# Patient Record
Sex: Female | Born: 1995 | Race: Black or African American | Hispanic: No | Marital: Single | Smoking: Never smoker
Health system: Southern US, Community
[De-identification: ages and names within clinical notes are randomized; demographics above are authoritative.]

## PROBLEM LIST (undated history)

## (undated) DIAGNOSIS — K219 Gastro-esophageal reflux disease without esophagitis: Secondary | ICD-10-CM

## (undated) DIAGNOSIS — B9620 Unspecified Escherichia coli [E. coli] as the cause of diseases classified elsewhere: Secondary | ICD-10-CM

## (undated) DIAGNOSIS — E119 Type 2 diabetes mellitus without complications: Secondary | ICD-10-CM

## (undated) DIAGNOSIS — K8020 Calculus of gallbladder without cholecystitis without obstruction: Secondary | ICD-10-CM

## (undated) DIAGNOSIS — N390 Urinary tract infection, site not specified: Principal | ICD-10-CM

## (undated) HISTORY — PX: TONSILLECTOMY AND ADENOIDECTOMY: SUR1326

## (undated) HISTORY — DX: Calculus of gallbladder without cholecystitis without obstruction: K80.20

## (undated) HISTORY — DX: Urinary tract infection, site not specified: N39.0

## (undated) HISTORY — DX: Unspecified Escherichia coli (E. coli) as the cause of diseases classified elsewhere: B96.20

---

## 2014-03-20 ENCOUNTER — Ambulatory Visit (INDEPENDENT_AMBULATORY_CARE_PROVIDER_SITE_OTHER): Payer: BC Managed Care – PPO | Admitting: Family Medicine

## 2014-03-20 VITALS — BP 130/80 | HR 69 | Temp 98.7°F | Resp 18 | Ht 64.0 in | Wt 265.0 lb

## 2014-03-20 DIAGNOSIS — Z87898 Personal history of other specified conditions: Secondary | ICD-10-CM

## 2014-03-20 DIAGNOSIS — Z6841 Body Mass Index (BMI) 40.0 and over, adult: Secondary | ICD-10-CM

## 2014-03-20 LAB — POCT CBC
GRANULOCYTE PERCENT: 43 % (ref 37–80)
HCT, POC: 41.6 % (ref 37.7–47.9)
Hemoglobin: 13.4 g/dL (ref 12.2–16.2)
LYMPH, POC: 4.4 — AB (ref 0.6–3.4)
MCH, POC: 25.5 pg — AB (ref 27–31.2)
MCHC: 32.3 g/dL (ref 31.8–35.4)
MCV: 79.1 fL — AB (ref 80–97)
MID (CBC): 0.3 (ref 0–0.9)
MPV: 6.5 fL (ref 0–99.8)
PLATELET COUNT, POC: 418 10*3/uL (ref 142–424)
POC Granulocyte: 3.5 (ref 2–6.9)
POC LYMPH %: 53.5 % — AB (ref 10–50)
POC MID %: 3.5 %M (ref 0–12)
RBC: 5.25 M/uL (ref 4.04–5.48)
RDW, POC: 13.6 %
WBC: 8.2 10*3/uL (ref 4.6–10.2)

## 2014-03-20 NOTE — Progress Notes (Signed)
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 She has a history of coronary to give plasma and on 3 separate occasions and had a pulse greater than 100. Daughters are required to have a pulse rate less than 100. She is healthy otherwise. She does not drink excessive caffeine. She does not smoke or use substances. She is on Implanon. She takes occasional tramadol for back pain and sciatica. She attends [employer] where she is studying human resources and plans to go into nursing after that. She also works as a Clinical cytogeneticistnanny and babysitter.  Objective: Overweight lady in no acute distress. Neck supple without nodes or thyromegaly. Chest clear. Heart regular without murmurs.pulse was 70 at the time hours examining her. She has a mild sinus arrhythmia.Evidence soft without mass or tenderness.  Assessment: History of tachycardia Obesity  Plan: Urged her to lose weight and try to get in more regular cardiac  Exercise.advise avoiding caffeine or other stimulants prior to giving of plasma. I believe she is safe to give plasma. Will check an EKG, TSH and CBC on her.  EKG is normal with a pulse of 73 on the EKG.  Approved for plasma donation

## 2014-03-20 NOTE — Patient Instructions (Signed)
Okay to donate plasma.  I will let you know the results of your thyroid test.

## 2014-03-21 LAB — TSH: TSH: 0.812 u[IU]/mL (ref 0.350–4.500)

## 2014-03-23 ENCOUNTER — Encounter: Payer: Self-pay | Admitting: Family Medicine

## 2014-03-25 ENCOUNTER — Encounter: Payer: Self-pay | Admitting: Family Medicine

## 2015-04-22 ENCOUNTER — Emergency Department (HOSPITAL_COMMUNITY): Payer: Self-pay

## 2015-04-22 ENCOUNTER — Emergency Department (HOSPITAL_COMMUNITY)
Admission: EM | Admit: 2015-04-22 | Discharge: 2015-04-22 | Disposition: A | Discharge status: Home / Self Care | Payer: Self-pay | Attending: Emergency Medicine | Admitting: Emergency Medicine

## 2015-04-22 ENCOUNTER — Encounter (HOSPITAL_COMMUNITY): Payer: Self-pay | Admitting: Emergency Medicine

## 2015-04-22 DIAGNOSIS — N390 Urinary tract infection, site not specified: Secondary | ICD-10-CM | POA: Insufficient documentation

## 2015-04-22 DIAGNOSIS — K8020 Calculus of gallbladder without cholecystitis without obstruction: Secondary | ICD-10-CM | POA: Insufficient documentation

## 2015-04-22 DIAGNOSIS — R61 Generalized hyperhidrosis: Secondary | ICD-10-CM | POA: Insufficient documentation

## 2015-04-22 DIAGNOSIS — Z79818 Long term (current) use of other agents affecting estrogen receptors and estrogen levels: Secondary | ICD-10-CM | POA: Insufficient documentation

## 2015-04-22 LAB — LIPASE, BLOOD: LIPASE: 32 U/L (ref 11–51)

## 2015-04-22 LAB — COMPREHENSIVE METABOLIC PANEL
ALT: 27 U/L (ref 14–54)
AST: 38 U/L (ref 15–41)
Albumin: 3.4 g/dL — ABNORMAL LOW (ref 3.5–5.0)
Alkaline Phosphatase: 76 U/L (ref 38–126)
Anion gap: 9 (ref 5–15)
BUN: 11 mg/dL (ref 6–20)
CALCIUM: 9.5 mg/dL (ref 8.9–10.3)
CHLORIDE: 104 mmol/L (ref 101–111)
CO2: 23 mmol/L (ref 22–32)
CREATININE: 0.76 mg/dL (ref 0.44–1.00)
GFR calc non Af Amer: >60 mL/min (ref >60)
Glucose, Bld: 280 mg/dL — ABNORMAL HIGH (ref 65–99)
Potassium: 3.9 mmol/L (ref 3.5–5.1)
SODIUM: 136 mmol/L (ref 135–145)
TOTAL PROTEIN: 7.8 g/dL (ref 6.5–8.1)
Total Bilirubin: 0.3 mg/dL (ref 0.3–1.2)

## 2015-04-22 LAB — URINALYSIS, ROUTINE W REFLEX MICROSCOPIC
Bilirubin Urine: NEGATIVE
KETONES UR: NEGATIVE mg/dL
Nitrite: POSITIVE — AB
PROTEIN: NEGATIVE mg/dL
Specific Gravity, Urine: 1.019 (ref 1.005–1.030)
pH: 6 (ref 5.0–8.0)

## 2015-04-22 LAB — CBC
HCT: 38 % (ref 36.0–46.0)
Hemoglobin: 12.9 g/dL (ref 12.0–15.0)
MCH: 26.6 pg (ref 26.0–34.0)
MCHC: 33.9 g/dL (ref 30.0–36.0)
MCV: 78.4 fL (ref 78.0–100.0)
PLATELETS: 348 10*3/uL (ref 150–400)
RBC: 4.85 MIL/uL (ref 3.87–5.11)
RDW: 13.7 % (ref 11.5–15.5)
WBC: 9.4 10*3/uL (ref 4.0–10.5)

## 2015-04-22 LAB — URINE MICROSCOPIC-ADD ON

## 2015-04-22 MED ORDER — RANITIDINE HCL 150 MG PO TABS: 150.0000 mg | ORAL_TABLET | Freq: Two times a day (BID) | ORAL | Status: DC

## 2015-04-22 MED ORDER — SULFAMETHOXAZOLE-TRIMETHOPRIM 800-160 MG PO TABS
1.0000 | ORAL_TABLET | Freq: Two times a day (BID) | ORAL | Status: AC
Start: 2015-04-22 — End: 2015-04-29

## 2015-04-22 MED ORDER — HYDROCODONE-ACETAMINOPHEN 5-325 MG PO TABS: 2.0000 | ORAL_TABLET | ORAL | Status: DC | PRN

## 2015-04-22 NOTE — ED Notes (Signed)
Pt from home c/o upper abdominal tightening. She reports that she got real nauseated and sweaty. She reports that she feels fine now. She reports that she has never felt this way before.

## 2015-04-22 NOTE — ED Provider Notes (Signed)
CSN: [id number]     Arrival date & time 04/22/15  0006 History  By signing my name below, I, Ronney LionSuzanne Le, attest that this documentation has been prepared under the direction and in the presence of Gilda Creasehristopher J Alexsandra Shontz, MD. Electronically Signed: Ronney LionSuzanne Le, ED Scribe. 04/22/2015. 1:39 AM.    Chief Complaint  Patient presents with  . Abdominal Pain   The history is provided by the patient. No language interpreter was used.    HPI Comments: Bridget Fletcher is a 19 y.o. female who presents to the Emergency Department complaining of two intermittent episodes of non-radiating epigastric abdominal pain that occurred last night. She reports that the episode last night lasted for 10 minutes and the episode today lasted for an hour. She also complains of feeling nauseated and sweaty during the episode. However, she states that she is presently asymptomatic. This is a new problem. She denies SOB.   History reviewed. No pertinent past medical history. History reviewed. No pertinent past surgical history. No family history on file. Social History  Substance Use Topics  . Smoking status: Never Smoker   . Smokeless tobacco: None  . Alcohol Use: No   OB History    No data available     Review of Systems  Constitutional: Positive for diaphoresis.  Gastrointestinal: Positive for nausea and abdominal pain.  All other systems reviewed and are negative.  Allergies  Review of patient's allergies indicates no known allergies.  Home Medications   Prior to Admission medications   Medication Sig Start Date End Date Taking? Authorizing Provider  etonogestrel (NEXPLANON) 68 MG IMPL implant 1 each by Subdermal route once.    Historical Provider, MD  etonogestrel-ethinyl estradiol (NUVARING) 0.12-0.015 MG/24HR vaginal ring Place 1 each vaginally every 28 (twenty-eight) days. Insert vaginally and leave in place for 3 consecutive weeks, then remove for 1 week.    Historical Provider, MD   HYDROcodone-acetaminophen (NORCO/VICODIN) 5-325 MG tablet Take 2 tablets by mouth every 4 (four) hours as needed for moderate pain. 04/22/15   Gilda Creasehristopher J Brooklin Rieger, MD  ranitidine (ZANTAC) 150 MG tablet Take 1 tablet (150 mg total) by mouth 2 (two) times daily. 04/22/15   Gilda Creasehristopher J Giovanni Bath, MD  sulfamethoxazole-trimethoprim (BACTRIM DS,SEPTRA DS) 800-160 MG tablet Take 1 tablet by mouth 2 (two) times daily. 04/22/15 04/29/15  Gilda Creasehristopher J Srihith Aquilino, MD   BP 123/106 mmHg  Pulse 99  Temp(Src) 98.7 F (37.1 C) (Oral)  Resp 16  Ht 5\' 4"  (1.626 m)  Wt 270 lb (122.471 kg)  BMI 46.32 kg/m2  SpO2 100%  LMP 04/08/2015 Physical Exam  Constitutional: She is oriented to person, place, and time. She appears well-developed and well-nourished. No distress.  HENT:  Head: Normocephalic and atraumatic.  Right Ear: Hearing normal.  Left Ear: Hearing normal.  Nose: Nose normal.  Mouth/Throat: Oropharynx is clear and moist and mucous membranes are normal.  Eyes: Conjunctivae and EOM are normal. Pupils are equal, round, and reactive to light.  Neck: Normal range of motion. Neck supple.  Cardiovascular: Regular rhythm, S1 normal and S2 normal.  Exam reveals no gallop and no friction rub.   No murmur heard. Pulmonary/Chest: Effort normal and breath sounds normal. No respiratory distress. She exhibits no tenderness.  Abdominal: Soft. Normal appearance and bowel sounds are normal. There is no hepatosplenomegaly. There is no tenderness. There is no rebound, no guarding, no tenderness at McBurney's point and negative Murphy's sign. No hernia.  Musculoskeletal: Normal range of motion.  Neurological: She is alert  and oriented to person, place, and time. She has normal strength. No cranial nerve deficit or sensory deficit. Coordination normal. GCS eye subscore is 4. GCS verbal subscore is 5. GCS motor subscore is 6.  Skin: Skin is warm, dry and intact. No rash noted. No cyanosis.  Psychiatric: She has a normal  mood and affect. Her speech is normal and behavior is normal. Thought content normal.  Nursing note and vitals reviewed.   ED Course  Procedures (including critical care time)  DIAGNOSTIC STUDIES: Oxygen Saturation is 100% on RA, normal by my interpretation.    COORDINATION OF CARE: 1:04 AM - Discussed treatment plan with pt at bedside which includes diagnostic tests. Pt verbalized understanding and agreed to plan.   Labs Review Labs Reviewed  COMPREHENSIVE METABOLIC PANEL - Abnormal; Notable for the following:    Glucose, Bld 280 (*)    Albumin 3.4 (*)    All other components within normal limits  URINALYSIS, ROUTINE W REFLEX MICROSCOPIC (NOT AT [facility]) - Abnormal; Notable for the following:    APPearance CLOUDY (*)    Glucose, UA >1000 (*)    Hgb urine dipstick SMALL (*)    Nitrite POSITIVE (*)    Leukocytes, UA MODERATE (*)    All other components within normal limits  URINE MICROSCOPIC-ADD ON - Abnormal; Notable for the following:    Squamous Epithelial / LPF 0-5 (*)    Bacteria, UA FEW (*)    All other components within normal limits  LIPASE, BLOOD  CBC    Imaging Review US Abdomen Limited Ruq  04/22/2015  CLINICAL DATA:  Right upper quadrant pain for 2 days.  Nausea. EXAM: US ABDOMEN LIMITED - RIGHT UPPER QUADRANT COMPARISON:  None. FINDINGS: Gallbladder: There are multiple calculi within the contracted gallbladder. There is no gallbladder mural thickening or pericholecystic fluid. The patient was not tender over the gallbladder. Common bile duct: Diameter: 3.5 mm Liver: There is generalized echogenicity and coarsening of hepatic echotexture without focal lesion. This could represent cirrhosis or fatty infiltration. IMPRESSION: Cholelithiasis. Dense coarsened hepatic echotexture suggesting cirrhosis or fatty infiltration. No focal liver lesion. Electronically Signed   By: Ellery Plunk M.D.   On: 04/22/2015 02:16   I have personally reviewed and evaluated these images  and lab results as part of my medical decision-making.   EKG Interpretation None      MDM   Final diagnoses:  Calculus of gallbladder without cholecystitis without obstruction  UTI (lower urinary tract infection)   Presented to the ER for evaluation of abdominal pain. Patient has had 2 episodes of upper abdominal pain associated with nausea and no vomiting. Both episodes spontaneously resolved. Neither was longer than 1 hour. She is symptom-free at arrival. Workup reveals gallstone. Symptoms likely secondary to cholelithiasis and biliary colic. Patient also has evidence of urinary tract infection. Treated for UTI, discharged and follow-up with general surgery. Given return precautions.   I personally performed the services described in this documentation, which was scribed in my presence. The recorded information has been reviewed and is accurate.'     Gilda Crease, MD 04/22/15 2308

## 2015-04-22 NOTE — ED Notes (Signed)
Patient is with Ultrasound

## 2015-04-22 NOTE — Discharge Instructions (Signed)
Biliary Colic Biliary colic is a pain in the upper abdomen. The pain:  Is usually felt on the right side of the abdomen, but it may also be felt in the center of the abdomen, just below the breastbone (sternum).  May spread back toward the right shoulder blade.  May be steady or irregular.  May be accompanied by nausea and vomiting. Most of the time, the pain goes away in 1-5 hours. After the most intense pain passes, the abdomen may continue to ache mildly for about 24 hours. Biliary colic is caused by a blockage in the bile duct. The bile duct is a pathway that carries bile--a liquid that helps to digest fats--from the gallbladder to the small intestine. Biliary colic usually occurs after eating, when the digestive system demands bile. The pain develops when muscle cells contract forcefully to try to move the blockage so that bile can get by. HOME CARE INSTRUCTIONS  Take medicines only as directed by your health care provider.  Drink enough fluid to keep your urine clear or pale yellow.  Avoid fatty, greasy, and fried foods. These kinds of foods increase your body's demand for bile.  Avoid any foods that make your pain worse.  Avoid overeating.  Avoid having a large meal after fasting. SEEK MEDICAL CARE IF:  You develop a fever.  Your pain gets worse.  You vomit.  You develop nausea that prevents you from eating and drinking. SEEK IMMEDIATE MEDICAL CARE IF:  You suddenly develop a fever and shaking chills.  You develop a yellowish discoloration (jaundice) of:  Skin.  Whites of the eyes.  Mucous membranes.  You have continuous or severe pain that is not relieved with medicines.  You have nausea and vomiting that is not relieved with medicines.  You develop dizziness or you faint.   This information is not intended to replace advice given to you by your health care provider. Make sure you discuss any questions you have with your health care provider.   Document  Released: 10/06/2005 Document Revised: 09/19/2014 Document Reviewed: 02/14/2014 Elsevier Interactive Patient Education 2016 Elsevier Inc.  Urinary Tract Infection Urinary tract infections (UTIs) can develop anywhere along your urinary tract. Your urinary tract is your body's drainage system for removing wastes and extra water. Your urinary tract includes two kidneys, two ureters, a bladder, and a urethra. Your kidneys are a pair of bean-shaped organs. Each kidney is about the size of your fist. They are located below your ribs, one on each side of your spine. CAUSES Infections are caused by microbes, which are microscopic organisms, including fungi, viruses, and bacteria. These organisms are so small that they can only be seen through a microscope. Bacteria are the microbes that most commonly cause UTIs. SYMPTOMS  Symptoms of UTIs may vary by age and gender of the patient and by the location of the infection. Symptoms in young women typically include a frequent and intense urge to urinate and a painful, burning feeling in the bladder or urethra during urination. Older women and men are more likely to be tired, shaky, and weak and have muscle aches and abdominal pain. A fever may mean the infection is in your kidneys. Other symptoms of a kidney infection include pain in your back or sides below the ribs, nausea, and vomiting. DIAGNOSIS To diagnose a UTI, your caregiver will ask you about your symptoms. Your caregiver will also ask you to provide a urine sample. The urine sample will be tested for bacteria and white  blood cells. White blood cells are made by your body to help fight infection. TREATMENT  Typically, UTIs can be treated with medication. Because most UTIs are caused by a bacterial infection, they usually can be treated with the use of antibiotics. The choice of antibiotic and length of treatment depend on your symptoms and the type of bacteria causing your infection. HOME CARE  INSTRUCTIONS  If you were prescribed antibiotics, take them exactly as your caregiver instructs you. Finish the medication even if you feel better after you have only taken some of the medication.  Drink enough water and fluids to keep your urine clear or pale yellow.  Avoid caffeine, tea, and carbonated beverages. They tend to irritate your bladder.  Empty your bladder often. Avoid holding urine for long periods of time.  Empty your bladder before and after sexual intercourse.  After a bowel movement, women should cleanse from front to back. Use each tissue only once. SEEK MEDICAL CARE IF:   You have back pain.  You develop a fever.  Your symptoms do not begin to resolve within 3 days. SEEK IMMEDIATE MEDICAL CARE IF:   You have severe back pain or lower abdominal pain.  You develop chills.  You have nausea or vomiting.  You have continued burning or discomfort with urination. MAKE SURE YOU:   Understand these instructions.  Will watch your condition.  Will get help right away if you are not doing well or get worse.   This information is not intended to replace advice given to you by your health care provider. Make sure you discuss any questions you have with your health care provider.   Document Released: 02/12/2005 Document Revised: 01/24/2015 Document Reviewed: 06/13/2011 Elsevier Interactive Patient Education [employer]

## 2015-05-23 ENCOUNTER — Other Ambulatory Visit (INDEPENDENT_AMBULATORY_CARE_PROVIDER_SITE_OTHER): Payer: Managed Care, Other (non HMO)

## 2015-05-23 ENCOUNTER — Encounter: Payer: Self-pay | Admitting: Internal Medicine

## 2015-05-23 ENCOUNTER — Ambulatory Visit (INDEPENDENT_AMBULATORY_CARE_PROVIDER_SITE_OTHER): Payer: Managed Care, Other (non HMO) | Admitting: Internal Medicine

## 2015-05-23 VITALS — BP 120/70 | HR 80 | Temp 98.7°F | Resp 16 | Ht 64.0 in | Wt 279.4 lb

## 2015-05-23 DIAGNOSIS — E669 Obesity, unspecified: Secondary | ICD-10-CM

## 2015-05-23 DIAGNOSIS — K760 Fatty (change of) liver, not elsewhere classified: Secondary | ICD-10-CM | POA: Diagnosis not present

## 2015-05-23 DIAGNOSIS — K8020 Calculus of gallbladder without cholecystitis without obstruction: Secondary | ICD-10-CM

## 2015-05-23 DIAGNOSIS — R7309 Other abnormal glucose: Secondary | ICD-10-CM | POA: Diagnosis not present

## 2015-05-23 DIAGNOSIS — E1169 Type 2 diabetes mellitus with other specified complication: Secondary | ICD-10-CM | POA: Insufficient documentation

## 2015-05-23 DIAGNOSIS — R739 Hyperglycemia, unspecified: Secondary | ICD-10-CM

## 2015-05-23 LAB — COMPREHENSIVE METABOLIC PANEL
ALBUMIN: 3.3 g/dL — AB (ref 3.5–5.2)
ALT: 14 U/L (ref 0–35)
AST: 14 U/L (ref 0–37)
Alkaline Phosphatase: 73 U/L (ref 39–117)
BUN: 8 mg/dL (ref 6–23)
CALCIUM: 9.2 mg/dL (ref 8.4–10.5)
CO2: 24 mEq/L (ref 19–32)
CREATININE: 0.73 mg/dL (ref 0.40–1.20)
Chloride: 104 mEq/L (ref 96–112)
GFR: 128.82 mL/min (ref >60.00)
Glucose, Bld: 142 mg/dL — ABNORMAL HIGH (ref 70–99)
Potassium: 3.9 mEq/L (ref 3.5–5.1)
Sodium: 138 mEq/L (ref 135–145)
TOTAL PROTEIN: 6.9 g/dL (ref 6.0–8.3)
Total Bilirubin: 0.3 mg/dL (ref 0.2–1.2)

## 2015-05-23 LAB — LIPID PANEL
Cholesterol: 181 mg/dL (ref 0–200)
HDL: 62.4 mg/dL (ref >39.00)
LDL Cholesterol: 84 mg/dL (ref 0–99)
NONHDL: 118.75
TRIGLYCERIDES: 174 mg/dL — AB (ref 0.0–149.0)
Total CHOL/HDL Ratio: 3
VLDL: 34.8 mg/dL (ref 0.0–40.0)

## 2015-05-23 LAB — HEMOGLOBIN A1C: HEMOGLOBIN A1C: 6.5 % (ref 4.6–6.5)

## 2015-05-23 LAB — TSH: TSH: 1.08 u[IU]/mL (ref 0.35–4.50)

## 2015-05-23 NOTE — Assessment & Plan Note (Signed)
Checking HgA1c, family history of diabetes for multiple people. Does have concurrent morbid obesity.

## 2015-05-23 NOTE — Progress Notes (Signed)
   Subjective:    Patient ID: Bridget Fletcher, female    DOB: 04/03/1996, 20 y.o.   MRN: [id number]  HPI The patient is a new 20 YO female coming in for gallbladder pain. She had a high sugar at the ER and the surgeon wants her to see a primary doctor before they will agree to do her gall bladder surgery. She is having some pains in her RUQ. Not worsened by foods. She does not exercise. Diabetes does run in her family. No fevers or chills. No chest pain or abdominal pain.   Review of Systems  Constitutional: Positive for activity change. Negative for fever, chills, appetite change, fatigue and unexpected weight change.  HENT: Negative.   Eyes: Negative.   Respiratory: Negative for cough, chest tightness, shortness of breath and wheezing.   Cardiovascular: Negative for chest pain, palpitations and leg swelling.  Gastrointestinal: Positive for diarrhea. Negative for nausea, abdominal pain, constipation and abdominal distention.  Musculoskeletal: Positive for myalgias. Negative for back pain and arthralgias.  Skin: Negative.   Neurological: Negative for dizziness, weakness, numbness and headaches.  Psychiatric/Behavioral: Negative.       Objective:   Physical Exam  Constitutional: She is oriented to person, place, and time. She appears well-developed and well-nourished.  Overweight  HENT:  Head: Normocephalic and atraumatic.  Eyes: EOM are normal.  Neck: Normal range of motion.  Cardiovascular: Normal rate and regular rhythm.   Pulmonary/Chest: Effort normal and breath sounds normal. No respiratory distress. She has no wheezes. She has no rales.  Abdominal: Soft. Bowel sounds are normal. She exhibits no distension. There is no tenderness. There is no rebound.  Musculoskeletal: She exhibits no edema.  Neurological: She is alert and oriented to person, place, and time. Coordination normal.  Skin: Skin is warm and dry.  Psychiatric: She has a normal mood and affect.   Filed Vitals:   05/23/15 0903  BP: 120/70  Pulse: 80  Temp: 98.7 F (37.1 C)  TempSrc: Oral  Resp: 16  Height: 5\' 4"  (1.626 m)  Weight: 279 lb 6.4 oz (126.735 kg)      Assessment & Plan:

## 2015-05-23 NOTE — Assessment & Plan Note (Signed)
Changes in echotexture on ultrasound and talked to her about the significance of that finding. She will work on exercise to help her to become healthier. Checking hepatitis panel to make sure no additional cause.

## 2015-05-23 NOTE — Assessment & Plan Note (Signed)
Complicated by cholelithiasis, fatty liver, elevated sugars. No HTN. Counseled her about the need to start an exercise program and work on eating healthier to form healthy habits. She declines nutritional counseling today. Checking labs.

## 2015-05-23 NOTE — Progress Notes (Signed)
Pre visit review using our clinic review tool, if applicable. No additional management support is needed unless otherwise documented below in the visit note. 

## 2015-05-23 NOTE — Assessment & Plan Note (Signed)
Will check labs today. If uncontrolled sugars would recommend to treat before surgery as she is not having symptoms at this time.

## 2015-05-23 NOTE — Patient Instructions (Signed)
We will check the labs today and call you back with the results.   It is really important to work on exercising to keep your body healthy and working on eating healthier foods. They saw some extra fat in the liver on the ultrasound along with the stones in the gallbladder. Working on exercise and diet can help with that as well. Having extra fat in the liver can cause the liver to age more quickly and wear out earlier.  Exercising to Stay Healthy Exercising regularly is important. It has many health benefits, such as:  Improving your overall fitness, flexibility, and endurance.  Increasing your bone density.  Helping with weight control.  Decreasing your body fat.  Increasing your muscle strength.  Reducing stress and tension.  Improving your overall health. In order to become healthy and stay healthy, it is recommended that you do moderate-intensity and vigorous-intensity exercise. You can tell that you are exercising at a moderate intensity if you have a higher heart rate and faster breathing, but you are still able to hold a conversation. You can tell that you are exercising at a vigorous intensity if you are breathing much harder and faster and cannot hold a conversation while exercising. HOW OFTEN SHOULD I EXERCISE? Choose an activity that you enjoy and set realistic goals. Your health care provider can help you to make an activity plan that works for you. Exercise regularly as directed by your health care provider. This may include:   Doing resistance training twice each week, such as:  Push-ups.  Sit-ups.  Lifting weights.  Using resistance bands.  Doing a given intensity of exercise for a given amount of time. Choose from these options:  150 minutes of moderate-intensity exercise every week.  75 minutes of vigorous-intensity exercise every week.  A mix of moderate-intensity and vigorous-intensity exercise every week. Children, pregnant women, people who are out of  shape, people who are overweight, and older adults may need to consult a health care provider for individual recommendations. If you have any sort of medical condition, be sure to consult your health care provider before starting a new exercise program.  WHAT ARE SOME EXERCISE IDEAS? Some moderate-intensity exercise ideas include:   Walking at a rate of 1 mile in 15 minutes.  Biking.  Hiking.  Golfing.  Dancing. Some vigorous-intensity exercise ideas include:   Walking at a rate of at least 4.5 miles per hour.  Jogging or running at a rate of 5 miles per hour.  Biking at a rate of at least 10 miles per hour.  Lap swimming.  Roller-skating or in-line skating.  Cross-country skiing.  Vigorous competitive sports, such as football, basketball, and soccer.  Jumping rope.  Aerobic dancing. WHAT ARE SOME EVERYDAY ACTIVITIES THAT CAN HELP ME TO GET EXERCISE?  Yard work, such as:  Child psychotherapist.  Raking and bagging leaves.  Washing and waxing your car.  Pushing a stroller.  Shoveling snow.  Gardening.  Washing windows or floors. HOW CAN I BE MORE ACTIVE IN MY DAY-TO-DAY ACTIVITIES?  Use the stairs instead of the elevator.  Take a walk during your lunch break.  If you drive, park your car farther away from work or school.  If you take public transportation, get off one stop early and walk the rest of the way.  Make all of your phone calls while standing up and walking around.  Get up, stretch, and walk around every 30 minutes throughout the day. WHAT GUIDELINES SHOULD I  FOLLOW WHILE EXERCISING?  Do not exercise so much that you hurt yourself, feel dizzy, or get very short of breath.  Consult your health care provider before starting a new exercise program.  Wear comfortable clothes and shoes with good support.  Drink plenty of water while you exercise to prevent dehydration or heat stroke. Body water is lost during exercise and must be  replaced.  Work out until you breathe faster and your heart beats faster.   Fatty Liver Fatty liver, also called hepatic steatosis or steatohepatitis, is a condition in which too much fat has built up in your liver cells. The liver removes harmful substances from your bloodstream. It produces fluids your body needs. It also helps your body use and store energy from the food you eat. In many cases, fatty liver does not cause symptoms or problems. It is often diagnosed when tests are being done for other reasons. However, over time, fatty liver can cause inflammation that may lead to more serious liver problems, such as scarring of the liver (cirrhosis). CAUSES  Causes of fatty liver may include:   Drinking too much alcohol.  Poor nutrition.  Obesity.  Cushing syndrome.  Diabetes.  Hyperlipidemia.  Pregnancy.  Certain drugs.  Poisons.  Some viral infections. RISK FACTORS You may be more likely to develop fatty liver if you:  Abuse alcohol.  Are pregnant.  Are overweight.  Have diabetes.  Have hepatitis.  Have a high triglyceride level.  SIGNS AND SYMPTOMS  Fatty liver often does not cause any symptoms. In cases where symptoms develop, they can include:  Fatigue.  Weakness.  Weight loss.  Confusion.   Abdominal pain.  Yellowing of your skin and the white parts of your eyes (jaundice).  Nausea and vomiting. DIAGNOSIS  Fatty liver may be diagnosed by:   Physical exam and medical history.  Blood tests.  Imaging tests, such as an ultrasound, CT scan, or MRI.  Liver biopsy. A small sample of liver tissue is removed using a needle. The sample is then looked at under a microscope. TREATMENT  Fatty liver is often caused by other health conditions. Treatment for fatty liver may involve medicines and lifestyle changes to manage conditions such as:   Alcoholism.  High cholesterol.  Diabetes.  Being overweight or obese.  HOME CARE INSTRUCTIONS  Eat  a healthy diet as directed by your health care provider.  Exercise regularly. This can help you lose weight and control your cholesterol and diabetes. Talk to your health care provider about an exercise plan and which activities are best for you.  Do not drink alcohol.   Take medicines only as directed by your health care provider. SEEK MEDICAL CARE IF: You have difficulty controlling your:  Blood sugar.  Cholesterol.  Alcohol consumption. SEEK IMMEDIATE MEDICAL CARE IF:  You have abdominal pain.  You have jaundice.  You have nausea and vomiting.   This information is not intended to replace advice given to you by your health care provider. Make sure you discuss any questions you have with your health care provider.   Document Released: 06/20/2005 Document Revised: 05/26/2014 Document Reviewed: 09/14/2013 Elsevier Interactive Patient Education [employer]  This information is not intended to replace advice given to you by your health care provider. Make sure you discuss any questions you have with your health care provider.   Document Released: 06/07/2010 Document Revised: 05/26/2014 Document Reviewed: 10/06/2013 Elsevier Interactive Patient Education [employer]

## 2015-05-24 ENCOUNTER — Telehealth: Payer: Self-pay | Admitting: Internal Medicine

## 2015-05-24 LAB — HEPATITIS PANEL, ACUTE
HCV Ab: NEGATIVE
HEP B S AG: NEGATIVE
Hep A IgM: NONREACTIVE
Hep B C IgM: NONREACTIVE

## 2015-05-24 MED ORDER — FLUTICASONE PROPIONATE 50 MCG/ACT NA SUSP: 2.0000 | Freq: Every day | NASAL | Status: DC

## 2015-05-24 NOTE — Telephone Encounter (Signed)
Done

## 2015-05-24 NOTE — Telephone Encounter (Signed)
Pt called state that Dr. Okey Duprerawford was going to send in Flonase to [facility]. Please check

## 2015-05-25 ENCOUNTER — Other Ambulatory Visit: Payer: Self-pay | Admitting: Internal Medicine

## 2015-05-25 MED ORDER — METFORMIN HCL 500 MG PO TABS: 500.0000 mg | ORAL_TABLET | Freq: Two times a day (BID) | ORAL | Status: DC

## 2015-06-04 ENCOUNTER — Ambulatory Visit: Payer: Self-pay | Admitting: General Surgery

## 2015-06-04 NOTE — H&P (Signed)
Bridget Fletcher 05/02/2015 10:43 AM Location: [facility] Patient #: [id number] DOB: 1995/09/02 Single / Language: [place] / Race: Black or African American Female   History of Present Illness Adolph Pollack MD; 05/02/2015 12:50 PM) Patient words: gallstones.  The patient is a 20 year old female.  Note:She is referred by Dr. Blinda Leatherwood, emergency department physician, because of symptomatic cholelithiasis. She had 2 separate episodes of epigastric type pain following a fatty meal. These were typical of biliary colic. She presented to the emergency department. Ultrasound report as below. Liver function tests were normal. Blood sugar was elevated at 280. CBC was normal. She has not been evaluated for the hyperglycemia.   US ABDOMEN LIMITED - RIGHT UPPER QUADRANT  COMPARISON: None.  FINDINGS: Gallbladder:  There are multiple calculi within the contracted gallbladder. There is no gallbladder mural thickening or pericholecystic fluid. The patient was not tender over the gallbladder.  Common bile duct:  Diameter: 3.5 mm  Liver:  There is generalized echogenicity and coarsening of hepatic echotexture without focal lesion. This could represent cirrhosis or fatty infiltration.  IMPRESSION: Cholelithiasis. Dense coarsened hepatic echotexture suggesting cirrhosis or fatty infiltration. No focal liver lesion.  Other Problems Fay Records, CMA; 05/02/2015 10:43 AM) Back Pain Cholelithiasis Gastroesophageal Reflux Disease Migraine Headache  Past Surgical History Fay Records, CMA; 05/02/2015 10:43 AM) Tonsillectomy  Diagnostic Studies History Fay Records, CMA; 05/02/2015 10:43 AM) Colonoscopy never Mammogram never Pap Smear 1-5 years ago  Allergies Fay Records, CMA; 05/02/2015 10:44 AM) No Known Drug Allergies12/14/2016  Medication History Fay Records, CMA; 05/02/2015 10:44 AM) NuvaRing (0.12-0.015MG /24HR Ring, Vaginal)  Active. Hydrocodone-Acetaminophen (5-325MG  Tablet, Oral) Active. Zantac (150MG  Tablet, Oral) Active. Medications Reconciled  Social History Fay Records, [place]; 05/02/2015 10:43 AM) Alcohol use Occasional alcohol use. Caffeine use Carbonated beverages, Coffee, Tea. No drug use Tobacco use Never smoker.  Family History Fay Records, [place]; 05/02/2015 10:43 AM) Hypertension Mother.  Pregnancy / Birth History Fay Records, CMA; 05/02/2015 10:43 AM) Age at menarche 9 years. Contraceptive History Contraceptive implant, Depo-provera, Oral contraceptives. Gravida 0 Irregular periods Para 0    Review of Systems Fay Records CMA; 05/02/2015 10:43 AM) General Not Present- Appetite Loss, Chills, Fatigue, Fever, Night Sweats, Weight Gain and Weight Loss. Skin Not Present- Change in Wart/Mole, Dryness, Hives, Jaundice, New Lesions, Non-Healing Wounds, Rash and Ulcer. HEENT Not Present- Earache, Hearing Loss, Hoarseness, Nose Bleed, Oral Ulcers, Ringing in the Ears, Seasonal Allergies, Sinus Pain, Sore Throat, Visual Disturbances, Wears glasses/contact lenses and Yellow Eyes. Respiratory Not Present- Bloody sputum, Chronic Cough, Difficulty Breathing, Snoring and Wheezing. Breast Not Present- Breast Mass, Breast Pain, Nipple Discharge and Skin Changes. Cardiovascular Not Present- Chest Pain, Difficulty Breathing Lying Down, Leg Cramps, Palpitations, Rapid Heart Rate, Shortness of Breath and Swelling of Extremities. Gastrointestinal Present- Nausea. Not Present- Abdominal Pain, Bloating, Bloody Stool, Change in Bowel Habits, Chronic diarrhea, Constipation, Difficulty Swallowing, Excessive gas, Gets full quickly at meals, Hemorrhoids, Indigestion, Rectal Pain and Vomiting. Female Genitourinary Not Present- Frequency, Nocturia, Painful Urination, Pelvic Pain and Urgency. Musculoskeletal Not Present- Back Pain, Joint Pain, Joint Stiffness, Muscle Pain, Muscle Weakness and Swelling of  Extremities. Neurological Not Present- Decreased Memory, Fainting, Headaches, Numbness, Seizures, Tingling, Tremor, Trouble walking and Weakness. Psychiatric Not Present- Anxiety, Bipolar, Change in Sleep Pattern, Depression, Fearful and Frequent crying. Endocrine Not Present- Cold Intolerance, Excessive Hunger, Hair Changes, Heat Intolerance, Hot flashes and New Diabetes. Hematology Not Present- Easy Bruising, Excessive bleeding, Gland problems, HIV and Persistent Infections.  Vitals Fay Records CMA; 05/02/2015 10:45  AM) 05/02/2015 10:44 AM Weight: 275 lb Height: 64in Body Surface Area: 2.24 m Body Mass Index: 47.2 kg/m  Temp.: 97.39F(Temporal)  Pulse: 80 (Regular)  BP: 128/82 (Sitting, Left Arm, Standard)       Assessment & Plan Adolph Pollack(Colena Ketterman J. Catheryn Slifer MD; 05/02/2015 11:18 AM) SYMPTOMATIC CHOLELITHIASIS (K80.20) Impression: She has had 2 episodes following fatty foods.  Plan: After her hyperglycemia is worked up, I recommended proceeding with laparoscopic cholecystectomy with cholangiogram. I also discussed a strict low-fat diet with her. I have explained the procedure, risks, and aftercare of cholecystectomy. Risks include but are not limited to bleeding, infection, wound problems, anesthesia, diarrhea, bile leak, injury to common bile duct/liver/intestine. She seems to understand and agrees to proceed.  Avel Peaceodd Ajeet Casasola, MD

## 2015-07-03 NOTE — Patient Instructions (Addendum)
Bridget Fletcher  07/03/2015   Your procedure is scheduled on: 07/06/2015   Report to [facility] Main  Entrance take [place]  elevators to 3rd floor to  Short Stay Center at   0700 AM.  Call this number if you have problems the morning of surgery [phone]   Remember: ONLY 1 PERSON MAY GO WITH YOU TO SHORT STAY TO GET  READY MORNING OF YOUR SURGERY.  Do not eat food or drink liquids :After Midnight.             Eat a good healthy snack prior to bedtime      Take these medicines the morning of surgery with A SIP OF WATER: Flonase if needed, Hydrocodone if needed , Ranitidine  DO NOT TAKE ANY DIABETIC MEDICATIONS DAY OF YOUR SURGERY                               You may not have any metal on your body including hair pins and              piercings  Do not wear jewelry, make-up, lotions, powders or perfumes, deodorant             Do not wear nail polish.  Do not shave  48 hours prior to surgery.             Do not bring valuables to the hospital. [facility] IS NOT             RESPONSIBLE   FOR VALUABLES.  Contacts, dentures or bridgework may not be worn into surgery.      Patients discharged the day of surgery will not be allowed to drive home.  Name and phone number of your driver:  Special Instructions: coughing and deep breathing exercises, leg exercises               Please read over the following fact sheets you were given: _____________________________________________________________________             [facility] - Preparing for Surgery Before surgery, you can play an important role.  Because skin is not sterile, your skin needs to be as free of germs as possible.  You can reduce the number of germs on your skin by washing with CHG (chlorahexidine gluconate) soap before surgery.  CHG is an antiseptic cleaner which kills germs and bonds with the skin to continue killing germs even after washing. Please DO NOT use if you have an allergy to CHG or  antibacterial soaps.  If your skin becomes reddened/irritated stop using the CHG and inform your nurse when you arrive at Short Stay. Do not shave (including legs and underarms) for at least 48 hours prior to the first CHG shower.  You may shave your face/neck. Please follow these instructions carefully:  1.  Shower with CHG Soap the night before surgery and the  morning of Surgery.  2.  If you choose to wash your hair, wash your hair first as usual with your  normal  shampoo.  3.  After you shampoo, rinse your hair and body thoroughly to remove the  shampoo.                           4.  Use CHG as you would any other liquid  soap.  You can apply chg directly  to the skin and wash                       Gently with a scrungie or clean washcloth.  5.  Apply the CHG Soap to your body ONLY FROM THE NECK DOWN.   Do not use on face/ open                           Wound or open sores. Avoid contact with eyes, ears mouth and genitals (private parts).                       Wash face,  Genitals (private parts) with your normal soap.             6.  Wash thoroughly, paying special attention to the area where your surgery  will be performed.  7.  Thoroughly rinse your body with warm water from the neck down.  8.  DO NOT shower/wash with your normal soap after using and rinsing off  the CHG Soap.                9.  Pat yourself dry with a clean towel.            10.  Wear clean pajamas.            11.  Place clean sheets on your bed the night of your first shower and do not  sleep with pets. Day of Surgery : Do not apply any lotions/deodorants the morning of surgery.  Please wear clean clothes to the hospital/surgery center.  FAILURE TO FOLLOW THESE INSTRUCTIONS MAY RESULT IN THE CANCELLATION OF YOUR SURGERY PATIENT SIGNATURE_________________________________  NURSE SIGNATURE__________________________________  ________________________________________________________________________

## 2015-07-04 ENCOUNTER — Encounter (HOSPITAL_COMMUNITY)
Admission: RE | Admit: 2015-07-04 | Discharge: 2015-07-04 | Disposition: A | Discharge status: Home / Self Care | Payer: Managed Care, Other (non HMO) | Source: Ambulatory Visit | Attending: General Surgery | Admitting: General Surgery

## 2015-07-04 ENCOUNTER — Encounter (HOSPITAL_COMMUNITY): Payer: Self-pay

## 2015-07-04 DIAGNOSIS — K219 Gastro-esophageal reflux disease without esophagitis: Secondary | ICD-10-CM | POA: Diagnosis not present

## 2015-07-04 DIAGNOSIS — K8020 Calculus of gallbladder without cholecystitis without obstruction: Secondary | ICD-10-CM | POA: Diagnosis present

## 2015-07-04 DIAGNOSIS — K8010 Calculus of gallbladder with chronic cholecystitis without obstruction: Secondary | ICD-10-CM | POA: Diagnosis not present

## 2015-07-04 DIAGNOSIS — E1165 Type 2 diabetes mellitus with hyperglycemia: Secondary | ICD-10-CM | POA: Diagnosis not present

## 2015-07-04 DIAGNOSIS — Z79899 Other long term (current) drug therapy: Secondary | ICD-10-CM | POA: Diagnosis not present

## 2015-07-04 DIAGNOSIS — Z7984 Long term (current) use of oral hypoglycemic drugs: Secondary | ICD-10-CM | POA: Diagnosis not present

## 2015-07-04 HISTORY — DX: Gastro-esophageal reflux disease without esophagitis: K21.9

## 2015-07-04 HISTORY — DX: Type 2 diabetes mellitus without complications: E11.9

## 2015-07-04 LAB — BASIC METABOLIC PANEL
ANION GAP: 9 (ref 5–15)
BUN: 5 mg/dL — AB (ref 6–20)
CALCIUM: 8.6 mg/dL — AB (ref 8.9–10.3)
CO2: 25 mmol/L (ref 22–32)
CREATININE: 0.68 mg/dL (ref 0.44–1.00)
Chloride: 104 mmol/L (ref 101–111)
GFR calc Af Amer: >60 mL/min (ref >60)
GLUCOSE: 116 mg/dL — AB (ref 65–99)
Potassium: 3.8 mmol/L (ref 3.5–5.1)
Sodium: 138 mmol/L (ref 135–145)

## 2015-07-04 LAB — CBC
HCT: 38.7 % (ref 36.0–46.0)
HEMOGLOBIN: 12.5 g/dL (ref 12.0–15.0)
MCH: 26 pg (ref 26.0–34.0)
MCHC: 32.3 g/dL (ref 30.0–36.0)
MCV: 80.5 fL (ref 78.0–100.0)
Platelets: 384 10*3/uL (ref 150–400)
RBC: 4.81 MIL/uL (ref 3.87–5.11)
RDW: 13.8 % (ref 11.5–15.5)
WBC: 5.6 10*3/uL (ref 4.0–10.5)

## 2015-07-04 LAB — HCG, SERUM, QUALITATIVE: PREG SERUM: NEGATIVE

## 2015-07-04 NOTE — Anesthesia Preprocedure Evaluation (Addendum)
Anesthesia Evaluation  Patient identified by MRN, date of birth, ID band Patient awake    Reviewed: Allergy & Precautions, NPO status , Patient's Chart, lab work & pertinent test results  Airway Mallampati: I  TM Distance: >3 FB Neck ROM: Full    Dental  (+) Teeth Intact   Pulmonary neg pulmonary ROS,    breath sounds clear to auscultation       Cardiovascular negative cardio ROS   Rhythm:Regular Rate:Normal     Neuro/Psych negative neurological ROS  negative psych ROS   GI/Hepatic Neg liver ROS, GERD  Medicated,  Endo/Other  diabetes, Type 2, Oral Hypoglycemic Agents  Renal/GU negative Renal ROS  negative genitourinary   Musculoskeletal negative musculoskeletal ROS (+)   Abdominal   Peds negative pediatric ROS (+)  Hematology negative hematology ROS (+)   Anesthesia Other Findings   Reproductive/Obstetrics negative OB ROS                            Lab Results  Component Value Date   WBC 9.4 04/22/2015   HGB 12.9 04/22/2015   HCT 38.0 04/22/2015   MCV 78.4 04/22/2015   PLT 348 04/22/2015   Lab Results  Component Value Date   CREATININE 0.73 05/23/2015   BUN 8 05/23/2015   NA 138 05/23/2015   K 3.9 05/23/2015   CL 104 05/23/2015   CO2 24 05/23/2015   No results found for: INR, PROTIME  06/2015 EKG: normal sinus rhythm.   Anesthesia Physical Anesthesia Plan  ASA: III  Anesthesia Plan: General   Post-op Pain Management:    Induction: Intravenous  Airway Management Planned: Oral ETT  Additional Equipment:   Intra-op Plan:   Post-operative Plan: Extubation in OR  Informed Consent: I have reviewed the patients History and Physical, chart, labs and discussed the procedure including the risks, benefits and alternatives for the proposed anesthesia with the patient or authorized representative who has indicated his/her understanding and acceptance.   Dental advisory  given  Plan Discussed with: CRNA  Anesthesia Plan Comments:         Anesthesia Quick Evaluation

## 2015-07-04 NOTE — Progress Notes (Signed)
Final EKG done 07/04/15 in EPIC.

## 2015-07-04 NOTE — Progress Notes (Signed)
HgA1C done 05/23/2015- EPIC

## 2015-07-05 MED ORDER — DEXTROSE 5 % IV SOLN
3.0000 g | INTRAVENOUS | Status: AC
  Administered 2015-07-06: 3 g via INTRAVENOUS
  Filled 2015-07-05 (×2): qty 3000

## 2015-07-06 ENCOUNTER — Encounter (HOSPITAL_COMMUNITY)
Admission: RE | Disposition: A | Discharge status: Home / Self Care | Payer: Self-pay | Source: Ambulatory Visit | Attending: General Surgery

## 2015-07-06 ENCOUNTER — Ambulatory Visit (HOSPITAL_COMMUNITY): Payer: Managed Care, Other (non HMO)

## 2015-07-06 ENCOUNTER — Ambulatory Visit (HOSPITAL_COMMUNITY): Payer: Managed Care, Other (non HMO) | Admitting: Anesthesiology

## 2015-07-06 ENCOUNTER — Ambulatory Visit (HOSPITAL_COMMUNITY)
Admission: RE | Admit: 2015-07-06 | Discharge: 2015-07-06 | Disposition: A | Discharge status: Home / Self Care | Payer: Managed Care, Other (non HMO) | Source: Ambulatory Visit | Attending: General Surgery | Admitting: General Surgery

## 2015-07-06 ENCOUNTER — Encounter (HOSPITAL_COMMUNITY): Payer: Self-pay

## 2015-07-06 DIAGNOSIS — K219 Gastro-esophageal reflux disease without esophagitis: Secondary | ICD-10-CM | POA: Insufficient documentation

## 2015-07-06 DIAGNOSIS — E1165 Type 2 diabetes mellitus with hyperglycemia: Secondary | ICD-10-CM | POA: Insufficient documentation

## 2015-07-06 DIAGNOSIS — Z79899 Other long term (current) drug therapy: Secondary | ICD-10-CM | POA: Insufficient documentation

## 2015-07-06 DIAGNOSIS — K8010 Calculus of gallbladder with chronic cholecystitis without obstruction: Secondary | ICD-10-CM | POA: Insufficient documentation

## 2015-07-06 DIAGNOSIS — Z7984 Long term (current) use of oral hypoglycemic drugs: Secondary | ICD-10-CM | POA: Insufficient documentation

## 2015-07-06 DIAGNOSIS — Z419 Encounter for procedure for purposes other than remedying health state, unspecified: Secondary | ICD-10-CM

## 2015-07-06 HISTORY — PX: CHOLECYSTECTOMY: SHX55

## 2015-07-06 LAB — GLUCOSE, CAPILLARY: GLUCOSE-CAPILLARY: 145 mg/dL — AB (ref 65–99)

## 2015-07-06 SURGERY — LAPAROSCOPIC CHOLECYSTECTOMY WITH INTRAOPERATIVE CHOLANGIOGRAM
Anesthesia: General

## 2015-07-06 MED ORDER — ACETAMINOPHEN 325 MG PO TABS: 650.0000 mg | ORAL_TABLET | ORAL | Status: DC | PRN

## 2015-07-06 MED ORDER — FENTANYL CITRATE (PF) 250 MCG/5ML IJ SOLN
INTRAMUSCULAR | Status: DC | PRN
  Administered 2015-07-06: 50 ug via INTRAVENOUS
  Administered 2015-07-06: 100 ug via INTRAVENOUS

## 2015-07-06 MED ORDER — PROMETHAZINE HCL 25 MG/ML IJ SOLN: 6.2500 mg | INTRAMUSCULAR | Status: DC | PRN

## 2015-07-06 MED ORDER — LIDOCAINE HCL (CARDIAC) 20 MG/ML IV SOLN
INTRAVENOUS | Status: AC
  Filled 2015-07-06: qty 5

## 2015-07-06 MED ORDER — NEOSTIGMINE METHYLSULFATE 10 MG/10ML IV SOLN
INTRAVENOUS | Status: AC
  Filled 2015-07-06: qty 1

## 2015-07-06 MED ORDER — HYDROMORPHONE HCL 1 MG/ML IJ SOLN
INTRAMUSCULAR | Status: AC
  Filled 2015-07-06: qty 1

## 2015-07-06 MED ORDER — SODIUM CHLORIDE 0.9% FLUSH: 3.0000 mL | Freq: Two times a day (BID) | INTRAVENOUS | Status: DC

## 2015-07-06 MED ORDER — GLYCOPYRROLATE 0.2 MG/ML IJ SOLN
INTRAMUSCULAR | Status: DC | PRN
  Administered 2015-07-06: 0.6 mg via INTRAVENOUS

## 2015-07-06 MED ORDER — ACETAMINOPHEN 650 MG RE SUPP
650.0000 mg | RECTAL | Status: DC | PRN
  Filled 2015-07-06: qty 1

## 2015-07-06 MED ORDER — MORPHINE SULFATE (PF) 10 MG/ML IV SOLN: 2.0000 mg | INTRAVENOUS | Status: DC | PRN

## 2015-07-06 MED ORDER — SCOPOLAMINE 1 MG/3DAYS TD PT72
MEDICATED_PATCH | TRANSDERMAL | Status: DC | PRN
  Administered 2015-07-06: 1 via TRANSDERMAL

## 2015-07-06 MED ORDER — OXYCODONE HCL 5 MG PO TABS: 5.0000 mg | ORAL_TABLET | ORAL | Status: DC | PRN

## 2015-07-06 MED ORDER — KETOROLAC TROMETHAMINE 30 MG/ML IJ SOLN
INTRAMUSCULAR | Status: AC
  Filled 2015-07-06: qty 1

## 2015-07-06 MED ORDER — ROCURONIUM BROMIDE 100 MG/10ML IV SOLN
INTRAVENOUS | Status: AC
  Filled 2015-07-06: qty 1

## 2015-07-06 MED ORDER — FENTANYL CITRATE (PF) 250 MCG/5ML IJ SOLN
INTRAMUSCULAR | Status: AC
  Filled 2015-07-06: qty 5

## 2015-07-06 MED ORDER — BUPIVACAINE HCL (PF) 0.5 % IJ SOLN
INTRAMUSCULAR | Status: AC
  Filled 2015-07-06: qty 30

## 2015-07-06 MED ORDER — LACTATED RINGERS IV SOLN
INTRAVENOUS | Status: DC | PRN
  Administered 2015-07-06: 1000 mL via INTRAVENOUS

## 2015-07-06 MED ORDER — OXYCODONE-ACETAMINOPHEN 5-325 MG PO TABS: 1.0000 | ORAL_TABLET | ORAL | Status: DC | PRN

## 2015-07-06 MED ORDER — DEXAMETHASONE SODIUM PHOSPHATE 10 MG/ML IJ SOLN
INTRAMUSCULAR | Status: AC
  Filled 2015-07-06: qty 1

## 2015-07-06 MED ORDER — SODIUM CHLORIDE 0.9 % IV SOLN: INTRAVENOUS | Status: DC

## 2015-07-06 MED ORDER — PROPOFOL 10 MG/ML IV BOLUS
INTRAVENOUS | Status: AC
  Filled 2015-07-06: qty 20

## 2015-07-06 MED ORDER — KETOROLAC TROMETHAMINE 30 MG/ML IJ SOLN
INTRAMUSCULAR | Status: DC | PRN
Start: 2015-07-06 — End: 2015-07-06
  Administered 2015-07-06: 30 mg via INTRAVENOUS

## 2015-07-06 MED ORDER — MEPERIDINE HCL 50 MG/ML IJ SOLN: 6.2500 mg | INTRAMUSCULAR | Status: DC | PRN

## 2015-07-06 MED ORDER — GLYCOPYRROLATE 0.2 MG/ML IJ SOLN
INTRAMUSCULAR | Status: AC
  Filled 2015-07-06: qty 3

## 2015-07-06 MED ORDER — ONDANSETRON HCL 4 MG/2ML IJ SOLN
INTRAMUSCULAR | Status: AC
  Filled 2015-07-06: qty 2

## 2015-07-06 MED ORDER — SODIUM CHLORIDE 0.9% FLUSH: 3.0000 mL | INTRAVENOUS | Status: DC | PRN

## 2015-07-06 MED ORDER — LACTATED RINGERS IV SOLN
INTRAVENOUS | Status: DC | PRN
  Administered 2015-07-06: 08:00:00 via INTRAVENOUS

## 2015-07-06 MED ORDER — PROPOFOL 10 MG/ML IV BOLUS
INTRAVENOUS | Status: DC | PRN
Start: 2015-07-06 — End: 2015-07-06
  Administered 2015-07-06: 150 mg via INTRAVENOUS

## 2015-07-06 MED ORDER — LACTATED RINGERS IV SOLN
INTRAVENOUS | Status: DC
  Administered 2015-07-06: 11:00:00 via INTRAVENOUS

## 2015-07-06 MED ORDER — DEXAMETHASONE SODIUM PHOSPHATE 10 MG/ML IJ SOLN
INTRAMUSCULAR | Status: DC | PRN
  Administered 2015-07-06: 10 mg via INTRAVENOUS

## 2015-07-06 MED ORDER — ONDANSETRON HCL 4 MG/2ML IJ SOLN
INTRAMUSCULAR | Status: DC | PRN
  Administered 2015-07-06: 4 mg via INTRAVENOUS

## 2015-07-06 MED ORDER — NEOSTIGMINE METHYLSULFATE 10 MG/10ML IV SOLN
INTRAVENOUS | Status: DC | PRN
  Administered 2015-07-06: 4 mg via INTRAVENOUS

## 2015-07-06 MED ORDER — MIDAZOLAM HCL 5 MG/5ML IJ SOLN
INTRAMUSCULAR | Status: DC | PRN
Start: 2015-07-06 — End: 2015-07-06
  Administered 2015-07-06: 2 mg via INTRAVENOUS

## 2015-07-06 MED ORDER — SCOPOLAMINE 1 MG/3DAYS TD PT72
MEDICATED_PATCH | TRANSDERMAL | Status: AC
  Filled 2015-07-06: qty 1

## 2015-07-06 MED ORDER — ONDANSETRON HCL 4 MG PO TABS: 4.0000 mg | ORAL_TABLET | ORAL | Status: DC | PRN

## 2015-07-06 MED ORDER — MIDAZOLAM HCL 2 MG/2ML IJ SOLN
INTRAMUSCULAR | Status: AC
  Filled 2015-07-06: qty 2

## 2015-07-06 MED ORDER — BUPIVACAINE HCL 0.5 % IJ SOLN
INTRAMUSCULAR | Status: DC | PRN
  Administered 2015-07-06: 30 mL

## 2015-07-06 MED ORDER — SUCCINYLCHOLINE CHLORIDE 20 MG/ML IJ SOLN
INTRAMUSCULAR | Status: DC | PRN
Start: 2015-07-06 — End: 2015-07-06
  Administered 2015-07-06: 100 mg via INTRAVENOUS

## 2015-07-06 MED ORDER — HYDROMORPHONE HCL 1 MG/ML IJ SOLN
0.2500 mg | INTRAMUSCULAR | Status: DC | PRN
  Administered 2015-07-06: 0.25 mg via INTRAVENOUS

## 2015-07-06 MED ORDER — ROCURONIUM BROMIDE 100 MG/10ML IV SOLN
INTRAVENOUS | Status: DC | PRN
  Administered 2015-07-06: 30 mg via INTRAVENOUS

## 2015-07-06 MED ORDER — LIDOCAINE HCL (CARDIAC) 20 MG/ML IV SOLN
INTRAVENOUS | Status: DC | PRN
  Administered 2015-07-06: 80 mg via INTRATRACHEAL

## 2015-07-06 MED ORDER — SODIUM CHLORIDE 0.9 % IV SOLN: 250.0000 mL | INTRAVENOUS | Status: DC | PRN

## 2015-07-06 SURGICAL SUPPLY — 39 items
APPLIER CLIP 5 13 M/L LIGAMAX5 (MISCELLANEOUS) ×3
BENZOIN TINCTURE PRP APPL 2/3 (GAUZE/BANDAGES/DRESSINGS) ×3 IMPLANT
CATH REDDICK CHOLANGI 4FR 50CM (CATHETERS) ×3 IMPLANT
CHLORAPREP W/TINT 26ML (MISCELLANEOUS) ×3 IMPLANT
CLIP APPLIE 5 13 M/L LIGAMAX5 (MISCELLANEOUS) ×1 IMPLANT
CLOSURE STERI-STRIP 1/4X4 (GAUZE/BANDAGES/DRESSINGS) ×3 IMPLANT
CLOSURE WOUND 1/2 X4 (GAUZE/BANDAGES/DRESSINGS) ×1
COVER MAYO STAND STRL (DRAPES) ×3 IMPLANT
COVER SURGICAL LIGHT HANDLE (MISCELLANEOUS) ×3 IMPLANT
DECANTER SPIKE VIAL GLASS SM (MISCELLANEOUS) ×3 IMPLANT
DISSECTOR BLUNT TIP ENDO 5MM (MISCELLANEOUS) IMPLANT
DRAPE C-ARM 42X120 X-RAY (DRAPES) ×3 IMPLANT
DRAPE LAPAROSCOPIC ABDOMINAL (DRAPES) ×3 IMPLANT
DRSG TEGADERM 2-3/8X2-3/4 SM (GAUZE/BANDAGES/DRESSINGS) ×9 IMPLANT
DRSG TEGADERM 4X4.75 (GAUZE/BANDAGES/DRESSINGS) ×3 IMPLANT
ELECT REM PT RETURN 9FT ADLT (ELECTROSURGICAL) ×3
ELECTRODE REM PT RTRN 9FT ADLT (ELECTROSURGICAL) ×1 IMPLANT
ENDOLOOP SUT PDS II  0 18 (SUTURE)
ENDOLOOP SUT PDS II 0 18 (SUTURE) IMPLANT
GAUZE SPONGE 2X2 8PLY STRL LF (GAUZE/BANDAGES/DRESSINGS) ×1 IMPLANT
GLOVE ECLIPSE 8.0 STRL XLNG CF (GLOVE) ×3 IMPLANT
GLOVE INDICATOR 8.0 STRL GRN (GLOVE) ×3 IMPLANT
GOWN STRL REUS W/TWL XL LVL3 (GOWN DISPOSABLE) ×9 IMPLANT
HEMOSTAT SNOW SURGICEL 2X4 (HEMOSTASIS) IMPLANT
IV CATH 14GX2 1/4 (CATHETERS) ×3 IMPLANT
KIT BASIN OR (CUSTOM PROCEDURE TRAY) ×3 IMPLANT
POUCH SPECIMEN RETRIEVAL 10MM (ENDOMECHANICALS) ×3 IMPLANT
SCISSORS LAP 5X35 DISP (ENDOMECHANICALS) ×3 IMPLANT
SET IRRIG TUBING LAPAROSCOPIC (IRRIGATION / IRRIGATOR) ×3 IMPLANT
SLEEVE XCEL OPT CAN 5 100 (ENDOMECHANICALS) ×6 IMPLANT
SPONGE GAUZE 2X2 STER 10/PKG (GAUZE/BANDAGES/DRESSINGS) ×2
STRIP CLOSURE SKIN 1/2X4 (GAUZE/BANDAGES/DRESSINGS) ×2 IMPLANT
SUT MNCRL AB 4-0 PS2 18 (SUTURE) ×3 IMPLANT
TOWEL OR 17X26 10 PK STRL BLUE (TOWEL DISPOSABLE) ×3 IMPLANT
TOWEL OR NON WOVEN STRL DISP B (DISPOSABLE) ×3 IMPLANT
TRAY LAPAROSCOPIC (CUSTOM PROCEDURE TRAY) ×3 IMPLANT
TROCAR BLADELESS OPT 5 100 (ENDOMECHANICALS) ×3 IMPLANT
TROCAR XCEL BLUNT TIP 100MML (ENDOMECHANICALS) ×3 IMPLANT
TROCAR XCEL NON-BLD 11X100MML (ENDOMECHANICALS) IMPLANT

## 2015-07-06 NOTE — Anesthesia Procedure Notes (Signed)
Procedure Name: Intubation Date/Time: 07/06/2015 9:12 AM Performed by: Delphia Grates Pre-anesthesia Checklist: Emergency Drugs available, Suction available, Patient being monitored and Patient identified Patient Re-evaluated:Patient Re-evaluated prior to inductionOxygen Delivery Method: Circle system utilized Preoxygenation: Pre-oxygenation with 100% oxygen Intubation Type: IV induction Laryngoscope Size: Mac and 4 Grade View: Grade I Tube type: Oral Airway Equipment and Method: Stylet Placement Confirmation: ETT inserted through vocal cords under direct vision and positive ETCO2 Secured at: 22 cm Tube secured with: Tape Dental Injury: Teeth and Oropharynx as per pre-operative assessment

## 2015-07-06 NOTE — Op Note (Signed)
OPERATIVE NOTE-LAPAROSCOPIC CHOLECYSTECTOMY  Preoperative diagnosis:  Symptomatic cholelithiasis  Postoperative diagnosis:  Same  Procedure: Laparoscopic cholecystectomy with cholangiogram.  Surgeon: Avel Peace, M.D.  Asst.:  Dr. Jaclynn Guarneri  Anesthesia: General  Indication:   This is a 20 year old female with biliary colic symptoms and gallstones. Liver function tests are normal. She presents now for elective laparoscopic cholecystectomy.  Technique: She was brought to the operating room, placed supine on the operating table, and a general anesthetic was administered. The hair on the abdominal wall was clipped as was necessary. The abdominal wall was then sterilely prepped and draped. Local anesthetic (Marcaine) was infiltrated in the subumbilical region. A small subumbilical incision was made through the skin, subcutaneous tissue, fascia, and peritoneum entering the peritoneal cavity under direct vision. A pursestring suture of 0 Vicryl was placed around the edges of the fascia. A Hassan trocar was introduced into the peritoneal cavity and a pneumoperitoneum was created by insufflation of carbon dioxide gas. The laparoscope was introduced into the trocar and no underlying bleeding or organ injury was noted. She was then placed in the reverse Trendelenburg position with the right side tilted slightly up.  Three 5 mm trocars were then placed into the abdominal cavity under laparoscopic vision. One in the epigastric area, and 2 in the right upper quadrant area. The gallbladder was visualized and the fundus was grasped and retracted toward the right shoulder.  There were no significant adhesions between the gallbladder and omentum or intestine.  The infundibulum was mobilized with dissection close to the gallbladder and retracted laterally. The cystic duct was identified and a window was created around it. The cystic artery was also identified and a window was created around it. The critical  view was achieved. A clip was placed at the neck of the gallbladder. A small incision was made in the cystic duct. A cholangiocatheter was introduced through the anterior abdominal wall and placed in the cystic duct. A intraoperative cholangiogram was then performed.  Under real-time fluoroscopy, dilute contrast was injected into the cystic duct.  The common hepatic duct, the right and left hepatic ducts, and the common duct were all visualized. Contrast drained into the duodenum without obvious evidence of any obstructing ductal lesion. The final report is pending the Radiologist's interpretation.  The cholangiocatheter was removed, the cystic duct was clipped 3 times on the biliary side, and then the cystic duct was divided sharply. No bile leak was noted from the cystic duct stump.  The cystic artery was then clipped and divided. Following this the gallbladder was dissected free from the liver using electrocautery. A small amount of bile spilled from the gallbladder during this process, but no stones spilled. The gallbladder was then placed in a retrieval bag and removed from the abdominal cavity through the subumbilical incision.  The gallbladder fossa was inspected, irrigated, and bleeding was controlled with electrocautery. Inspection showed that hemostasis was adequate and there was no evidence of bile leak.  The irrigation fluid was evacuated as much as possible.  The subumbilical trocar was removed and the fascial defect was closed by tightening and tying down the pursestring suture under laparoscopic vision.  The remaining trocars were removed and the pneumoperitoneum was released. The skin incisions were closed with 4-0 Monocryl subcuticular stitches. Steri-Strips and sterile dressings were applied.  The procedure was well-tolerated without any apparent complications. She was taken to the recovery room in satisfactory condition.

## 2015-07-06 NOTE — Discharge Instructions (Signed)
LAPAROSCOPIC GALLBLADDER SURGERY: POST OP INSTRUCTIONS  1. DIET: Follow a light bland diet the first 24 hours after arrival home, such as soup, liquids, crackers, etc.  Be sure to include lots of fluids daily.  Avoid fast food or heavy meals as your are more likely to get nauseated.  Eat a low fat the next few days after surgery.   2. Take your usually prescribed home medications unless otherwise directed. 3. PAIN CONTROL: a. Pain is best controlled by a usual combination of three different methods TOGETHER: i. Ice/Heat ii. Over the counter pain medication iii. Prescription pain medication b. Most patients will experience some swelling and bruising around the incisions.  Ice packs or heating pads (30-60 minutes up to 6 times a day) will help. Use ice for the first few days to help decrease swelling and bruising, then switch to heat to help relax tight/sore spots and speed recovery.  Some people prefer to use ice alone, heat alone, alternating between ice & heat.  Experiment to what works for you.  Swelling and bruising can take several weeks to resolve.   c. It is helpful to take an over-the-counter pain medication regularly for the first few weeks.  Choose one of the following that works best for you: i. Naproxen (Aleve, etc)  Two  tabs twice a day ii. Ibuprofen (Advil, etc) Three  tabs four times a day (every meal & bedtime) iii. Acetaminophen (Tylenol, etc) 500-650mg  four times a day (every meal & bedtime) d. A  prescription for pain medication (such as oxycodone, hydrocodone, etc) should be given to you upon discharge.  Take your pain medication as prescribed.  i. If you are having problems/concerns with the prescription medicine (does not control pain, nausea, vomiting, rash, itching, etc), please call us [phone] to see if we need to switch you to a different pain medicine that will work better for you and/or control your side effect better. ii. If you need a refill on your pain  medication, please contact your pharmacy.  They will contact our office to request authorization. Prescriptions will not be filled after 5 pm or on week-ends. 4. Avoid getting constipated.  Between the surgery and the pain medications, it is common to experience some constipation.  Increasing fluid intake and taking a fiber supplement (such as Metamucil, Citrucel, FiberCon, MiraLax, etc) 1-2 times a day regularly will usually help prevent this problem from occurring.  A mild laxative (prune juice, Milk of Magnesia, MiraLax, etc) should be taken according to package directions if there are no bowel movements after 48 hours.   5. Watch out for diarrhea.  If you have many loose bowel movements, simplify your diet to bland foods & liquids for a few days.  Stop any stool softeners and decrease your fiber supplement.  Switching to mild anti-diarrheal medications (Kayopectate, Pepto Bismol) can help.  If this worsens or does not improve, please call us. 6. Wash / shower every day.  You may shower over the dressings as they are waterproof.  Continue to shower over incision(s) after the dressing is off. 7. Remove your waterproof bandages 3 days after surgery.  You may leave the incision open to air.  You may replace a dressing/Band-Aid to cover the incision for comfort if you wish.  8. ACTIVITIES as tolerated:   a. You may resume regular (light) daily activities beginning the next day--such as daily self-care, walking, climbing stairs--gradually increasing light activities as tolerated.  No heavy lifting (over 10 pounds), straining,  or intense activities for 2 weeks. b. DO NOT PUSH THROUGH PAIN.  Let pain be your guide: If it hurts to do something, don't do it.  Pain is your body warning you to avoid that activity for another week until the pain goes down. c. You may drive when you are no longer taking prescription pain medication, you can comfortably wear a seatbelt, and you can safely maneuver your car and apply  brakes. d. Bonita Quin may have sexual intercourse when it is comfortable.  9. FOLLOW UP in our office a. Please call [facility] at [phone] to set up an appointment to see your surgeon in the office for a follow-up appointment approximately 2-3 weeks after your surgery. b. Make sure that you call for this appointment the day you arrive home to insure a convenient appointment time. 10. IF YOU HAVE DISABILITY OR FAMILY LEAVE FORMS, BRING THEM TO THE OFFICE FOR PROCESSING.  DO NOT GIVE THEM TO YOUR DOCTOR.  11.  Return to work/school:  Desk work/light activities in 5-7 days, full duty/activities in 2 weeks if pain-free.   WHEN TO CALL us [phone]: 1. Poor pain control 2. Reactions / problems with new medications (rash/itching, nausea, etc)  3. Fever over 101.5 F (38.5 C) 4. Inability to urinate 5. Nausea and/or vomiting 6. Worsening swelling or bruising 7. Continued bleeding from incision. 8. Increased pain, redness, or drainage from the incision   The clinic staff is available to answer your questions during regular business hours (8:30am-5pm).  Please dont hesitate to call and ask to speak to one of our nurses for clinical concerns.   If you have a medical emergency, go to the nearest emergency room or call 911.  A surgeon from [facility] is always on call at the [facility], [place] [address], [place], [place]  [address] ? MAIN: [phone] ? TOLL FREE: [phone] ?  FAX [phone] [email or web address]   General Anesthesia, Adult, Care After Refer to this sheet in the next few weeks. These instructions provide you with information on caring for yourself after your procedure. Your health care provider may also give you more specific instructions. Your treatment has been planned according to current medical practices, but problems sometimes occur. Call your health care provider if you have any problems or  questions after your procedure. WHAT TO EXPECT AFTER THE PROCEDURE After the procedure, it is typical to experience:  Sleepiness.  Nausea and vomiting. HOME CARE INSTRUCTIONS  For the first 24 hours after general anesthesia:  Have a responsible person with you.  Do not drive a car. If you are alone, do not take public transportation.  Do not drink alcohol.  Do not take medicine that has not been prescribed by your health care provider.  Do not sign important papers or make important decisions.  You may resume a normal diet and activities as directed by your health care provider.  Change bandages (dressings) as directed.  If you have questions or problems that seem related to general anesthesia, call the hospital and ask for the anesthetist or anesthesiologist on call. SEEK MEDICAL CARE IF:  You have nausea and vomiting that continue the day after anesthesia.  You develop a rash. SEEK IMMEDIATE MEDICAL CARE IF:   You have difficulty breathing.  You have chest pain.  You have any allergic problems.   This information is not intended to replace advice given to you by your health care provider.  Make sure you discuss any questions you have with your health care provider.   Document Released: 08/11/2000 Document Revised: 05/26/2014 Document Reviewed: 09/03/2011 Elsevier Interactive Patient Education [employer].

## 2015-07-06 NOTE — Transfer of Care (Signed)
Immediate Anesthesia Transfer of Care Note  Patient: Bridget Fletcher  Procedure(s) Performed: Procedure(s): LAPAROSCOPIC CHOLECYSTECTOMY WITH INTRAOPERATIVE CHOLANGIOGRAM (N/A)  Patient Location: PACU  Anesthesia Type:General  Level of Consciousness: awake, alert , oriented and patient cooperative  Airway & Oxygen Therapy: Patient Spontanous Breathing and Patient connected to face mask oxygen  Post-op Assessment: Report given to RN and Post -op Vital signs reviewed and stable  Post vital signs: Reviewed and stable  Last Vitals:  Filed Vitals:   07/06/15 0651  BP: 132/82  Pulse: 90  Temp: 36.8 C  Resp: 16    Complications: No apparent anesthesia complications

## 2015-07-06 NOTE — H&P (Signed)
Bridget Fletcher is an 20 y.o. female.   Chief Complaint:   She present today for elective cholecystectomy HPI:  She has had episodes of biliary colic type abdominal pain and presented to the ED on 04/22/15 where she was noted to have cholelithiasis and hyperglycemia.  LFTs and CBC were normal at that time. [place] results are below.  There are multiple calculi within the contracted gallbladder. There is no gallbladder mural thickening or pericholecystic fluid. The patient was not tender over the gallbladder.  Common bile duct:  Diameter: 3.5 mm  Liver:  There is generalized echogenicity and coarsening of hepatic echotexture without focal lesion. This could represent cirrhosis or fatty infiltration.  Past Medical History  Diagnosis Date  . Gall stones   . Diabetes mellitus without complication (HCC)   . GERD (gastroesophageal reflux disease)     Past Surgical History  Procedure Laterality Date  . Tonsillectomy and adenoidectomy      Family History  Problem Relation Age of Onset  . Hypertension Mother   . Hypertension Maternal Grandmother    Social History:  reports that she has never smoked. She has never used smokeless tobacco. She reports that she drinks alcohol. She reports that she does not use illicit drugs.  Allergies: No Known Allergies   Prior to Admission medications   Medication Sig Start Date End Date Taking? Authorizing Provider  etonogestrel-ethinyl estradiol (NUVARING) 0.12-0.015 MG/24HR vaginal ring Place 1 each vaginally every 28 (twenty-eight) days. Insert vaginally and leave in place for 3 consecutive weeks, then remove for 1 week.   Yes Historical Provider, MD  fluticasone (FLONASE) 50 MCG/ACT nasal spray Place 2 sprays into both nostrils daily. Patient taking differently: Place 2 sprays into both nostrils daily as needed for allergies.  05/24/15  Yes Myrlene Broker, MD  HYDROcodone-acetaminophen (NORCO/VICODIN) 5-325 MG tablet Take 2 tablets by mouth  every 4 (four) hours as needed for moderate pain. 04/22/15  Yes Gilda Crease, MD  ibuprofen (ADVIL,MOTRIN) 200 MG tablet Take 800 mg by mouth every 6 (six) hours as needed for cramping.   Yes Historical Provider, MD  metFORMIN (GLUCOPHAGE) 500 MG tablet Take 1 tablet (500 mg total) by mouth 2 (two) times daily with a meal. First week take once a day only. 05/25/15  Yes Myrlene Broker, MD  Phenyleph-CPM-DM-APAP (TYLENOL COLD MULTI-SYMPTOM) 09-17-08-325 MG MISC Take 2 tablets by mouth as needed.   Yes Historical Provider, MD  ranitidine (ZANTAC) 150 MG capsule Take 150 mg by mouth 2 (two) times daily. As needed   Yes Historical Provider, MD     Medications Prior to Admission  Medication Sig Dispense Refill  . etonogestrel-ethinyl estradiol (NUVARING) 0.12-0.015 MG/24HR vaginal ring Place 1 each vaginally every 28 (twenty-eight) days. Insert vaginally and leave in place for 3 consecutive weeks, then remove for 1 week.    . fluticasone (FLONASE) 50 MCG/ACT nasal spray Place 2 sprays into both nostrils daily. (Patient taking differently: Place 2 sprays into both nostrils daily as needed for allergies. ) 16 g 6  . HYDROcodone-acetaminophen (NORCO/VICODIN) 5-325 MG tablet Take 2 tablets by mouth every 4 (four) hours as needed for moderate pain. 10 tablet 0  . ibuprofen (ADVIL,MOTRIN) 200 MG tablet Take 800 mg by mouth every 6 (six) hours as needed for cramping.    . metFORMIN (GLUCOPHAGE) 500 MG tablet Take 1 tablet (500 mg total) by mouth 2 (two) times daily with a meal. First week take once a day only. 180 tablet 3  .  Phenyleph-CPM-DM-APAP (TYLENOL COLD MULTI-SYMPTOM) 09-17-08-325 MG MISC Take 2 tablets by mouth as needed.    . ranitidine (ZANTAC) 150 MG capsule Take 150 mg by mouth 2 (two) times daily. As needed      Results for orders placed or performed during the hospital encounter of 07/06/15 (from the past 48 hour(s))  Glucose, capillary     Status: Abnormal   Collection Time: 07/06/15   6:54 AM  Result Value Ref Range   Glucose-Capillary 145 (H) 65 - 99 mg/dL   Comment 1 Notify RN    No results found.  Review of Systems  Constitutional: Negative for fever and chills.  HENT: Positive for congestion.     Blood pressure 132/82, pulse 90, temperature 98.2 F (36.8 C), temperature source Oral, resp. rate 16, height  (1.626 m), weight 126.1 kg (278 lb), last menstrual period 07/02/2015, SpO2 97 %. Physical Exam  Constitutional:  Obese female in nad  Eyes: No scleral icterus.  Cardiovascular: Normal rate and regular rhythm.   Respiratory: Effort normal and breath sounds normal.  GI: Soft. She exhibits no mass. There is no tenderness.  obese  Musculoskeletal: She exhibits no edema.  Neurological: She is alert.     Assessment/Plan Symptomatic cholelithiasis  Plan:  Lap chole with IOC  Monty Spicher J, MD 07/06/2015, 8:27 AM

## 2015-07-06 NOTE — Anesthesia Postprocedure Evaluation (Signed)
Anesthesia Post Note  Patient: Bridget Fletcher  Procedure(s) Performed: Procedure(s) (LRB): LAPAROSCOPIC CHOLECYSTECTOMY WITH INTRAOPERATIVE CHOLANGIOGRAM (N/A)  Patient location during evaluation: PACU Anesthesia Type: General Level of consciousness: awake and alert Pain management: pain level controlled Vital Signs Assessment: post-procedure vital signs reviewed and stable Respiratory status: spontaneous breathing, nonlabored ventilation, respiratory function stable and patient connected to nasal cannula oxygen Cardiovascular status: blood pressure returned to baseline and stable Postop Assessment: no signs of nausea or vomiting Anesthetic complications: no    Last Vitals:  Filed Vitals:   07/06/15 1130 07/06/15 1145  BP: 120/69 125/80  Pulse: 73 77  Temp:    Resp: 17 17    Last Pain:  Filed Vitals:   07/06/15 1146  PainSc: Asleep                 Shelton Silvas

## 2015-07-29 DIAGNOSIS — N390 Urinary tract infection, site not specified: Secondary | ICD-10-CM

## 2015-07-29 DIAGNOSIS — B9620 Unspecified Escherichia coli [E. coli] as the cause of diseases classified elsewhere: Secondary | ICD-10-CM

## 2015-07-29 HISTORY — DX: Urinary tract infection, site not specified: N39.0

## 2015-07-29 HISTORY — DX: Unspecified Escherichia coli (E. coli) as the cause of diseases classified elsewhere: B96.20

## 2015-08-02 ENCOUNTER — Ambulatory Visit (INDEPENDENT_AMBULATORY_CARE_PROVIDER_SITE_OTHER): Payer: Managed Care, Other (non HMO) | Admitting: Internal Medicine

## 2015-08-02 ENCOUNTER — Other Ambulatory Visit (INDEPENDENT_AMBULATORY_CARE_PROVIDER_SITE_OTHER): Payer: Managed Care, Other (non HMO)

## 2015-08-02 ENCOUNTER — Encounter: Payer: Self-pay | Admitting: Internal Medicine

## 2015-08-02 VITALS — BP 92/60 | HR 107 | Temp 99.0°F | Wt 276.0 lb

## 2015-08-02 DIAGNOSIS — R112 Nausea with vomiting, unspecified: Secondary | ICD-10-CM

## 2015-08-02 DIAGNOSIS — M791 Myalgia: Secondary | ICD-10-CM | POA: Diagnosis not present

## 2015-08-02 DIAGNOSIS — R509 Fever, unspecified: Secondary | ICD-10-CM

## 2015-08-02 DIAGNOSIS — IMO0001 Reserved for inherently not codable concepts without codable children: Secondary | ICD-10-CM

## 2015-08-02 DIAGNOSIS — N390 Urinary tract infection, site not specified: Secondary | ICD-10-CM

## 2015-08-02 DIAGNOSIS — N760 Acute vaginitis: Secondary | ICD-10-CM

## 2015-08-02 DIAGNOSIS — M2550 Pain in unspecified joint: Secondary | ICD-10-CM | POA: Diagnosis not present

## 2015-08-02 DIAGNOSIS — B9620 Unspecified Escherichia coli [E. coli] as the cause of diseases classified elsewhere: Secondary | ICD-10-CM

## 2015-08-02 DIAGNOSIS — M609 Myositis, unspecified: Secondary | ICD-10-CM

## 2015-08-02 LAB — BASIC METABOLIC PANEL
BUN: 5 mg/dL — ABNORMAL LOW (ref 6–23)
CALCIUM: 8.9 mg/dL (ref 8.4–10.5)
CHLORIDE: 102 meq/L (ref 96–112)
CO2: 26 mEq/L (ref 19–32)
CREATININE: 0.79 mg/dL (ref 0.40–1.20)
GFR: 117.38 mL/min (ref >60.00)
Glucose, Bld: 116 mg/dL — ABNORMAL HIGH (ref 70–99)
Potassium: 3.5 mEq/L (ref 3.5–5.1)
Sodium: 136 mEq/L (ref 135–145)

## 2015-08-02 LAB — POCT INFLUENZA A/B
INFLUENZA A, POC: NEGATIVE
INFLUENZA B, POC: NEGATIVE

## 2015-08-02 LAB — CBC WITH DIFFERENTIAL/PLATELET
BASOS ABS: 0.1 10*3/uL (ref 0.0–0.1)
Basophils Relative: 0.7 % (ref 0.0–3.0)
Eosinophils Absolute: 0.1 10*3/uL (ref 0.0–0.7)
Eosinophils Relative: 0.9 % (ref 0.0–5.0)
HCT: 36.6 % (ref 36.0–46.0)
HEMOGLOBIN: 12 g/dL (ref 12.0–15.0)
LYMPHS ABS: 3.2 10*3/uL (ref 0.7–4.0)
Lymphocytes Relative: 36.5 % (ref 12.0–46.0)
MCHC: 32.7 g/dL (ref 30.0–36.0)
MCV: 77.5 fl — AB (ref 78.0–100.0)
MONOS PCT: 16.2 % — AB (ref 3.0–12.0)
Monocytes Absolute: 1.4 10*3/uL — ABNORMAL HIGH (ref 0.1–1.0)
NEUTROS PCT: 45.7 % (ref 43.0–77.0)
Neutro Abs: 4.1 10*3/uL (ref 1.4–7.7)
Platelets: 412 10*3/uL — ABNORMAL HIGH (ref 150.0–400.0)
RBC: 4.72 Mil/uL (ref 3.87–5.11)
RDW: 13.9 % (ref 11.5–15.5)
WBC: 8.9 10*3/uL (ref 4.0–10.5)

## 2015-08-02 MED ORDER — FLUCONAZOLE 150 MG PO TABS: 150.0000 mg | ORAL_TABLET | Freq: Once | ORAL | Status: DC

## 2015-08-02 NOTE — Progress Notes (Signed)
Pre visit review using our clinic review tool, if applicable. No additional management support is needed unless otherwise documented below in the visit note. 

## 2015-08-02 NOTE — Progress Notes (Signed)
   Subjective:    Patient ID: Bridget Fletcher, female    DOB: 03/31/1996, 20 y.o.   MRN: [id number]  HPI  [facility] [place] fever, chills, headache, and generalized body aches. Urine was described as abnormal and she was placed on Cipro. Culture was collected but results are not available. She continues to have vaginal itching for which she used an OTC product.  She's had isolated episode of stiffness in her neck.  She had nausea and vomiting last night and has had some improvement in her generalized symptoms. She did not take the flu shot. She continues to have some head pressure.  Her blood pressure has been elevated up to 144 and 146/90+ on 2 occasions. Additionally random glucose was 176.  She is requesting "TSH, A1c, comprehensive metabolic profile, HFP, lipid profile, and CBC and differential". She had these tests done within the last 3 months. In fact she had labs done in December 2016 as well as in January and February this year. A copy of these were provided to her and the results discussed.  Review of Systems  Frontal sinus or facial pain , nasal purulence, dental pain, sore throat , otic pain or otic discharge denied. No significant fever , chills or sweats now.  There is no significant cough, sputum production,hemoptysis, wheezing,or  paroxysmal nocturnal dyspnea. Unexplained weight loss, abdominal pain, significant dyspepsia, dysphagia, melena, rectal bleeding, or persistently small caliber stools are not present. Dysuria, pyuria, hematuria, frequency, nocturia or polyuria are denied now.     Objective:   Physical Exam Pertinent or positive findings include: See BMI of almost 49 with obesity. She has striae over the abdomen. Abdomen is protuberant. There is varus deformity of the knees present. There is full range of motion of the head and neck. Straight leg raising is negative.  General appearance :adequately nourished; in no  distress.  Eyes: No conjunctival inflammation or scleral icterus is present.  Oral exam:  Lips and gums are healthy appearing.There is no oropharyngeal erythema or exudate noted. Dental hygiene is good.  Heart:  Normal rate and regular rhythm. S1 and S2 normal without gallop, murmur, click, rub or other extra sounds    Lungs:Chest clear to auscultation; no wheezes, rhonchi,rales ,or rubs present.No increased work of breathing.   Abdomen: bowel sounds normal, soft and non-tender without masses, organomegaly or hernias noted.  No guarding or rebound. No flank tenderness to percussion.  Vascular : all pulses equal ; no bruits present.  Skin:Warm & dry.  Intact without suspicious lesions or rashes ; no tenting    Lymphatic: No lymphadenopathy is noted about the head, neck, axilla  Neuro: Strength, tone & DTRs normal.     Assessment & Plan:  #1 fever, chills, myalgia, arthralgia suggesting viral syndrome   #2 vaginitis  #3 diabetes with adequate control based on up-to-date A1c  #4 hypertension; guidelines discussed  Plan: See orders and recommendations  Note: FAXed  07/29/15 urine C&S reviewed ; E coli UTI sensitive to Cipro present. Also yeast isolated

## 2015-08-02 NOTE — Patient Instructions (Addendum)
Minimal Blood Pressure Goal= AVERAGE < 140/90;  Ideal is an AVERAGE < 135/85. This AVERAGE should be calculated from @ least 5-7 BP readings taken @ different times of day on different days of week. You should not respond to isolated BP readings , but rather the AVERAGE for that week .Please bring your  blood pressure cuff to office visits to verify that it is reliable.It  can also be checked against the blood pressure device at the pharmacy. Finger or wrist cuffs are not dependable; an arm cuff is. A1c assesses average 24 hour  glucose over prior 6-12 weeks.  No Diabetes risk if < 6.1%  "Pre Diabetes" :6.2-6.4 % Good diabetic control: 6.5-7 % Fair diabetic control: 7-8 % Poor diabetic control: greater than 8 % ( except with additional factors such as  advanced age; significant coronary or neurologic disease,etc).  An  A1c of 7 % or less  is the safest goal for you.Most importantly, it is critical to prevent hypoglycemia.  Recheck A1c in 2-4 months. Goals for home glucose monitoring are : fasting  or morning glucose goal of  100-150. Two hours after any meal , goal = < 180, preferably < 160. Report any low blood glucoses immediately. Please bring your diary of glucose & blood pressure readings & all actual pill bottles to all appts.   Stay on clear liquids for 48-72 hours or until bowels are normal.This would include  jello, sherbert (NOT ice cream), Lipton's chicken noodle soup(NOT cream based soups),Gatorade Lite, flat Ginger ale (without High Fructose Corn Syrup),dry toast or crackers, baked potato.No milk , dairy or grease until bowels are formed. Align , a [employer] Biotic , daily if stools are loose. Immodium AD for frankly watery stool. Report increasing pain, fever or rectal bleeding.

## 2015-08-03 ENCOUNTER — Encounter: Payer: Self-pay | Admitting: Internal Medicine

## 2015-09-13 ENCOUNTER — Ambulatory Visit: Payer: Self-pay | Admitting: Internal Medicine

## 2015-09-13 ENCOUNTER — Encounter (HOSPITAL_COMMUNITY): Payer: Self-pay | Admitting: Emergency Medicine

## 2015-09-13 ENCOUNTER — Encounter: Payer: Self-pay | Admitting: Family

## 2015-09-13 ENCOUNTER — Emergency Department (HOSPITAL_COMMUNITY): Payer: Managed Care, Other (non HMO)

## 2015-09-13 ENCOUNTER — Telehealth: Payer: Self-pay | Admitting: Internal Medicine

## 2015-09-13 ENCOUNTER — Emergency Department (HOSPITAL_COMMUNITY)
Admission: EM | Admit: 2015-09-13 | Discharge: 2015-09-13 | Disposition: A | Discharge status: Home / Self Care | Payer: Managed Care, Other (non HMO) | Attending: Emergency Medicine | Admitting: Emergency Medicine

## 2015-09-13 ENCOUNTER — Ambulatory Visit (INDEPENDENT_AMBULATORY_CARE_PROVIDER_SITE_OTHER): Payer: Managed Care, Other (non HMO) | Admitting: Family

## 2015-09-13 VITALS — BP 124/80 | HR 125 | Temp 101.3°F | Ht 64.0 in | Wt 275.0 lb

## 2015-09-13 DIAGNOSIS — Z79891 Long term (current) use of opiate analgesic: Secondary | ICD-10-CM | POA: Diagnosis not present

## 2015-09-13 DIAGNOSIS — J020 Streptococcal pharyngitis: Secondary | ICD-10-CM | POA: Diagnosis not present

## 2015-09-13 DIAGNOSIS — R51 Headache: Secondary | ICD-10-CM | POA: Insufficient documentation

## 2015-09-13 DIAGNOSIS — R509 Fever, unspecified: Secondary | ICD-10-CM

## 2015-09-13 DIAGNOSIS — E119 Type 2 diabetes mellitus without complications: Secondary | ICD-10-CM | POA: Diagnosis not present

## 2015-09-13 DIAGNOSIS — R000 Tachycardia, unspecified: Secondary | ICD-10-CM

## 2015-09-13 DIAGNOSIS — Z7984 Long term (current) use of oral hypoglycemic drugs: Secondary | ICD-10-CM | POA: Insufficient documentation

## 2015-09-13 DIAGNOSIS — K219 Gastro-esophageal reflux disease without esophagitis: Secondary | ICD-10-CM | POA: Insufficient documentation

## 2015-09-13 DIAGNOSIS — Z791 Long term (current) use of non-steroidal anti-inflammatories (NSAID): Secondary | ICD-10-CM | POA: Insufficient documentation

## 2015-09-13 DIAGNOSIS — R6889 Other general symptoms and signs: Secondary | ICD-10-CM | POA: Diagnosis not present

## 2015-09-13 DIAGNOSIS — Z79899 Other long term (current) drug therapy: Secondary | ICD-10-CM | POA: Insufficient documentation

## 2015-09-13 DIAGNOSIS — M542 Cervicalgia: Secondary | ICD-10-CM | POA: Diagnosis not present

## 2015-09-13 LAB — CSF CELL COUNT WITH DIFFERENTIAL
RBC COUNT CSF: 12 /mm3 — AB
RBC COUNT CSF: 3430 /mm3 — AB
TUBE #: 1
Tube #: 4
WBC CSF: 2 /mm3 (ref 0–5)
WBC, CSF: 2 /mm3 (ref 0–5)

## 2015-09-13 LAB — BASIC METABOLIC PANEL
ANION GAP: 13 (ref 5–15)
BUN: <5 mg/dL — ABNORMAL LOW (ref 6–20)
CALCIUM: 8.7 mg/dL — AB (ref 8.9–10.3)
CHLORIDE: 101 mmol/L (ref 101–111)
CO2: 24 mmol/L (ref 22–32)
Creatinine, Ser: 0.86 mg/dL (ref 0.44–1.00)
GFR calc non Af Amer: >60 mL/min (ref >60)
Glucose, Bld: 104 mg/dL — ABNORMAL HIGH (ref 65–99)
Potassium: 3.2 mmol/L — ABNORMAL LOW (ref 3.5–5.1)
SODIUM: 138 mmol/L (ref 135–145)

## 2015-09-13 LAB — GLUCOSE, CSF: GLUCOSE CSF: 87 mg/dL — AB (ref 40–70)

## 2015-09-13 LAB — CBC
HEMATOCRIT: 37.1 % (ref 36.0–46.0)
HEMOGLOBIN: 12.4 g/dL (ref 12.0–15.0)
MCH: 25.5 pg — ABNORMAL LOW (ref 26.0–34.0)
MCHC: 33.4 g/dL (ref 30.0–36.0)
MCV: 76.2 fL — ABNORMAL LOW (ref 78.0–100.0)
Platelets: 276 10*3/uL (ref 150–400)
RBC: 4.87 MIL/uL (ref 3.87–5.11)
RDW: 14.3 % (ref 11.5–15.5)
WBC: 16.6 10*3/uL — AB (ref 4.0–10.5)

## 2015-09-13 LAB — PROTEIN, CSF: TOTAL PROTEIN, CSF: 17 mg/dL (ref 15–45)

## 2015-09-13 LAB — POCT INFLUENZA A: RAPID INFLUENZA A AGN: NEGATIVE

## 2015-09-13 LAB — RAPID STREP SCREEN (MED CTR MEBANE ONLY): STREPTOCOCCUS, GROUP A SCREEN (DIRECT): POSITIVE — AB

## 2015-09-13 MED ORDER — LIDOCAINE HCL 1 % IJ SOLN
INTRAMUSCULAR | Status: AC
  Administered 2015-09-13: 20 mL
  Filled 2015-09-13: qty 20

## 2015-09-13 MED ORDER — ACETAMINOPHEN 500 MG PO TABS
1000.0000 mg | ORAL_TABLET | Freq: Once | ORAL | Status: AC
  Administered 2015-09-13: 1000 mg via ORAL
  Filled 2015-09-13: qty 2

## 2015-09-13 MED ORDER — AMOXICILLIN 500 MG PO CAPS: 500.0000 mg | ORAL_CAPSULE | Freq: Three times a day (TID) | ORAL | Status: DC

## 2015-09-13 MED ORDER — DEXTROSE 5 % IV SOLN
2.0000 g | Freq: Once | INTRAVENOUS | Status: AC
  Administered 2015-09-13: 2 g via INTRAVENOUS
  Filled 2015-09-13: qty 2

## 2015-09-13 MED ORDER — SODIUM CHLORIDE 0.9 % IV SOLN
INTRAVENOUS | Status: DC
  Administered 2015-09-13: 16:00:00 via INTRAVENOUS

## 2015-09-13 MED ORDER — MORPHINE SULFATE (PF) 4 MG/ML IV SOLN
4.0000 mg | Freq: Once | INTRAVENOUS | Status: AC
  Administered 2015-09-13: 4 mg via INTRAVENOUS
  Filled 2015-09-13: qty 1

## 2015-09-13 MED ORDER — LIDOCAINE HCL 2 % IJ SOLN
INTRAMUSCULAR | Status: AC
  Filled 2015-09-13: qty 20

## 2015-09-13 MED ORDER — SODIUM CHLORIDE 0.9 % IV BOLUS (SEPSIS)
1000.0000 mL | Freq: Once | INTRAVENOUS | Status: AC
  Administered 2015-09-13: 1000 mL via INTRAVENOUS

## 2015-09-13 MED ORDER — LIDOCAINE HCL (PF) 1 % IJ SOLN
5.0000 mL | Freq: Once | INTRAMUSCULAR | Status: AC
  Administered 2015-09-13: 5 mL
  Filled 2015-09-13: qty 5

## 2015-09-13 MED ORDER — KETOROLAC TROMETHAMINE 30 MG/ML IJ SOLN
30.0000 mg | Freq: Once | INTRAMUSCULAR | Status: AC
  Administered 2015-09-13: 30 mg via INTRAVENOUS
  Filled 2015-09-13: qty 1

## 2015-09-13 MED ORDER — MENTHOL 3 MG MT LOZG
1.0000 | LOZENGE | OROMUCOSAL | Status: DC | PRN
  Filled 2015-09-13 (×2): qty 9

## 2015-09-13 MED ORDER — ONDANSETRON HCL 4 MG/2ML IJ SOLN
4.0000 mg | Freq: Once | INTRAMUSCULAR | Status: AC
  Administered 2015-09-13: 4 mg via INTRAVENOUS
  Filled 2015-09-13: qty 2

## 2015-09-13 MED ORDER — LORAZEPAM 2 MG/ML IJ SOLN
1.0000 mg | Freq: Once | INTRAMUSCULAR | Status: AC
  Administered 2015-09-13: 1 mg via INTRAVENOUS
  Filled 2015-09-13: qty 1

## 2015-09-13 NOTE — Progress Notes (Signed)
Pre visit review using our clinic review tool, if applicable. No additional management support is needed unless otherwise documented below in the visit note. 

## 2015-09-13 NOTE — Telephone Encounter (Signed)
Dustin/office to call patient to see if she would rather come to [place] office, margaret NP has open schedule

## 2015-09-13 NOTE — Discharge Instructions (Signed)

## 2015-09-13 NOTE — ED Provider Notes (Signed)
CSN: [id number]     Arrival date & time 09/13/15  1220 History   First MD Initiated Contact with Patient 09/13/15 1333     Chief Complaint  Patient presents with  . Neck Pain  . Fever   (Consider location/radiation/quality/duration/timing/severity/associated sxs/prior Treatment) HPI 20 y.o. female, presents to the Emergency Department today complaining of fever, headache x 2 days. Pain is 10/10 and constant with no relief. Has tried Tylenol with minimal relief. Notes associated generalized body aches, sore throat. No N/V/D. No visual changes. Pt was sent from PCP with concern for meningitis due to neck rigidity along with fever.     Past Medical History  Diagnosis Date  . Gall stones   . Diabetes mellitus without complication (HCC)   . GERD (gastroesophageal reflux disease)   . E. coli UTI 07/29/15    [facility] ; sensitive to Cipro   Past Surgical History  Procedure Laterality Date  . Tonsillectomy and adenoidectomy    . Cholecystectomy N/A 07/06/2015    Procedure: LAPAROSCOPIC CHOLECYSTECTOMY WITH INTRAOPERATIVE CHOLANGIOGRAM;  Surgeon: Avel Peaceodd Rosenbower, MD;  Location: [facility];  Service: General;  Laterality: N/A;   Family History  Problem Relation Age of Onset  . Hypertension Mother   . Hypertension Maternal Grandmother    Social History  Substance Use Topics  . Smoking status: Never Smoker   . Smokeless tobacco: Never Used  . Alcohol Use: 0.0 oz/week    0 Standard drinks or equivalent per week     Comment: rare   OB History    No data available     Review of Systems ROS reviewed and all are negative for acute change except as noted in the HPI.  Allergies  Review of patient's allergies indicates no known allergies.  Home Medications   Prior to Admission medications   Medication Sig Start Date End Date Taking? Authorizing Provider  etonogestrel-ethinyl estradiol (NUVARING) 0.12-0.015 MG/24HR vaginal ring Place 1 each vaginally every 28 (twenty-eight) days.  Insert vaginally and leave in place for 3 consecutive weeks, then remove for 1 week.   Yes Historical Provider, MD  fluticasone (FLONASE) 50 MCG/ACT nasal spray Place 2 sprays into both nostrils daily. Patient taking differently: Place 2 sprays into both nostrils daily as needed for allergies.  05/24/15  Yes Myrlene BrokerElizabeth A Crawford, MD  ibuprofen (ADVIL,MOTRIN) 200 MG tablet Take 800 mg by mouth every 6 (six) hours as needed for cramping.   Yes Historical Provider, MD  metFORMIN (GLUCOPHAGE) 500 MG tablet Take 1 tablet (500 mg total) by mouth 2 (two) times daily with a meal. First week take once a day only. 05/25/15  Yes Myrlene BrokerElizabeth A Crawford, MD  fluconazole (DIFLUCAN) 150 MG tablet Take 1 tablet (150 mg total) by mouth once. Patient not taking: Reported on 09/13/2015 08/02/15   Pecola LawlessWilliam F Hopper, MD  HYDROcodone-acetaminophen (NORCO/VICODIN) 5-325 MG tablet Take 2 tablets by mouth every 4 (four) hours as needed for moderate pain. Patient not taking: Reported on 09/13/2015 04/22/15   Gilda Creasehristopher J Pollina, MD  ondansetron (ZOFRAN) 4 MG tablet Take 1 tablet (4 mg total) by mouth every 4 (four) hours as needed for nausea. Patient not taking: Reported on 09/13/2015 07/06/15   Avel Peaceodd Rosenbower, MD   BP 131/84 mmHg  Pulse 124  Temp(Src) 99.8 F (37.7 C) (Oral)  Resp 16  SpO2 95%   Physical Exam  Constitutional: She is oriented to person, place, and time. She appears well-developed and well-nourished.  HENT:  Head: Normocephalic and atraumatic.  Right Ear: Hearing, tympanic membrane, external ear and ear canal normal.  Left Ear: Hearing, tympanic membrane, external ear and ear canal normal.  Nose: Nose normal.  Mouth/Throat: Uvula is midline and mucous membranes are normal. No trismus in the jaw. No dental abscesses or uvula swelling. Posterior oropharyngeal erythema present. No oropharyngeal exudate, posterior oropharyngeal edema or tonsillar abscesses.  Eyes: EOM are normal. Pupils are equal, round, and  reactive to light.  Neck: Trachea normal and normal range of motion. Rigidity present. No Brudzinski's sign and no Kernig's sign noted.  Cardiovascular: Regular rhythm, normal heart sounds and intact distal pulses.   No murmur heard. Pulmonary/Chest: Effort normal. No stridor. No respiratory distress. She has no wheezes. She has no rales. She exhibits no tenderness.  Abdominal: Soft. There is no tenderness.  Musculoskeletal: Normal range of motion.  Neurological: She is alert and oriented to person, place, and time.  Skin: Skin is warm and dry.  Psychiatric: She has a normal mood and affect. Her behavior is normal. Thought content normal.  Nursing note and vitals reviewed.  ED Course  Procedures (including critical care time) Labs Review Labs Reviewed  RAPID STREP SCREEN (NOT AT [facility]) - Abnormal; Notable for the following:    Streptococcus, Group A Screen (Direct) POSITIVE (*)    All other components within normal limits  CBC - Abnormal; Notable for the following:    WBC 16.6 (*)    MCV 76.2 (*)    MCH 25.5 (*)    All other components within normal limits  BASIC METABOLIC PANEL - Abnormal; Notable for the following:    Potassium 3.2 (*)    Glucose, Bld 104 (*)    BUN <5 (*)    Calcium 8.7 (*)    All other components within normal limits  CSF CULTURE  CULTURE, BLOOD (ROUTINE X 2)  CULTURE, BLOOD (ROUTINE X 2)  CSF CELL COUNT WITH DIFFERENTIAL  CSF CELL COUNT WITH DIFFERENTIAL  GLUCOSE, CSF  PROTEIN, CSF   Imaging Review Dg Chest 2 View  09/13/2015  CLINICAL DATA:  Sob, fever x 3 days.  nonsmoker EXAM: CHEST  2 VIEW COMPARISON:  None. FINDINGS: The heart size and mediastinal contours are within normal limits. Both lungs are clear. No pleural effusion or pneumothorax. The visualized skeletal structures are unremarkable. IMPRESSION: Normal chest radiographs. Electronically Signed   By: Amie Portland M.D.   On: 09/13/2015 14:01   I have personally reviewed and evaluated these  images and lab results as part of my medical decision-making.   EKG Interpretation None      MDM  I have reviewed and evaluated the relevant laboratory values I have reviewed and evaluated the relevant imaging studies.  I have reviewed the relevant previous healthcare records. I obtained HPI from historian. Patient discussed with supervising physician  ED Course:  Assessment: Pt is a 19yF who presents with headache, fever x 2 days. On exam, pt in NAD. Nontoxic/nonseptic appearing. VS with Tachycardia. Febrile at 101.42F. Neck is rigid, but pt has full ROM of neck. No trismus. No abscess identified. No uvula deviation. Negative kernig/brud. Lungs CTA. Heart RRR. Abdomen nontender soft. CBC with WBC 16.6. Given Rocephin 2g due to concern for meningitis. Lumbar Puncture performed with collection of spinal fluid. Strep return positive. Given fluids in ED. If CSF unremarkable, will dc home with treatment for Strep.  Disposition/Plan:  Dispo pending CSF results 4:08 PM Sign out to: Roxy Horseman, PA-C   Supervising Physician Arby Barrette, MD  Final diagnoses:  None        Audry Pili, PA-C 09/13/15 1609  Arby Barrette, MD 09/15/15 [phone]

## 2015-09-13 NOTE — Progress Notes (Signed)
Subjective:    Patient ID: Bridget Fletcher, female    DOB: 01/03/1996, 20 y.o.   MRN: [id number]   Bridget Fletcher is a 20 y.o. female who presents today for an acute visit.    HPI Comments: Notes tactile fever for 4 days. Lives at home. Niece had pneumonia last week.   Tonsillectomy.   Cough This is a new problem. The current episode started in the past 7 days. The problem has been unchanged. The problem occurs every few hours. The cough is productive of sputum. Associated symptoms include a fever, headaches and a sore throat. Pertinent negatives include no chest pain, chills, ear pain, myalgias, rash, shortness of breath or wheezing. Treatments tried: ibuprofen, Nyquil. The treatment provided no relief. There is no history of asthma, environmental allergies or pneumonia.   Past Medical History  Diagnosis Date  . Gall stones   . Diabetes mellitus without complication (HCC)   . GERD (gastroesophageal reflux disease)   . E. coli UTI 07/29/15    [facility] ; sensitive to Cipro   Review of patient's allergies indicates no known allergies. Current Outpatient Prescriptions on File Prior to Visit  Medication Sig Dispense Refill  . etonogestrel-ethinyl estradiol (NUVARING) 0.12-0.015 MG/24HR vaginal ring Place 1 each vaginally every 28 (twenty-eight) days. Insert vaginally and leave in place for 3 consecutive weeks, then remove for 1 week.    . fluticasone (FLONASE) 50 MCG/ACT nasal spray Place 2 sprays into both nostrils daily. (Patient taking differently: Place 2 sprays into both nostrils daily as needed for allergies. ) 16 g 6  . ibuprofen (ADVIL,MOTRIN) 200 MG tablet Take 800 mg by mouth every 6 (six) hours as needed for cramping.    . metFORMIN (GLUCOPHAGE) 500 MG tablet Take 1 tablet (500 mg total) by mouth 2 (two) times daily with a meal. First week take once a day only. 180 tablet 3  . ciprofloxacin (CIPRO) 500 MG tablet Take 1 tablet by mouth every 12 (twelve) hours.  0  .  fluconazole (DIFLUCAN) 150 MG tablet Take 1 tablet (150 mg total) by mouth once. (Patient not taking: Reported on 09/13/2015) 1 tablet 0  . HYDROcodone-acetaminophen (NORCO/VICODIN) 5-325 MG tablet Take 2 tablets by mouth every 4 (four) hours as needed for moderate pain. (Patient not taking: Reported on 09/13/2015) 10 tablet 0  . ondansetron (ZOFRAN) 4 MG tablet Take 1 tablet (4 mg total) by mouth every 4 (four) hours as needed for nausea. (Patient not taking: Reported on 09/13/2015) 20 tablet 0  . oxyCODONE-acetaminophen (ROXICET) 5-325 MG tablet Take 1-2 tablets by mouth every 4 (four) hours as needed for severe pain. (Patient not taking: Reported on 09/13/2015) 30 tablet 0  . Phenyleph-CPM-DM-APAP (TYLENOL COLD MULTI-SYMPTOM) 09-17-08-325 MG MISC Take 2 tablets by mouth as needed. Reported on 09/13/2015    . ranitidine (ZANTAC) 150 MG capsule Take 150 mg by mouth 2 (two) times daily. Reported on 09/13/2015     No current facility-administered medications on file prior to visit.    Social History  Substance Use Topics  . Smoking status: Never Smoker   . Smokeless tobacco: Never Used  . Alcohol Use: 0.0 oz/week    0 Standard drinks or equivalent per week     Comment: rare    Review of Systems  Constitutional: Positive for fever. Negative for chills and unexpected weight change.  HENT: Positive for congestion and sore throat. Negative for ear pain and sinus pressure.   Eyes: Negative for visual disturbance.  Respiratory: Positive for cough. Negative for shortness of breath and wheezing.   Cardiovascular: Negative for chest pain, palpitations and leg swelling.  Gastrointestinal: Negative for nausea and vomiting.  Musculoskeletal: Negative for myalgias, arthralgias, neck pain and neck stiffness.  Skin: Negative for rash.  Allergic/Immunologic: Negative for environmental allergies.  Neurological: Positive for headaches.  Hematological: Negative for adenopathy.      Objective:    BP 124/80  mmHg  Pulse 125  Temp(Src) 101.3 F (38.5 C) (Oral)  Ht  (1.626 m)  Wt 275 lb (124.739 kg)  BMI 47.18 kg/m2  SpO2 98%   Physical Exam  Constitutional: She appears well-developed and well-nourished.  HENT:  Head: Normocephalic and atraumatic.  Right Ear: Hearing, tympanic membrane, external ear and ear canal normal. No drainage, swelling or tenderness. No foreign bodies. Tympanic membrane is not erythematous and not bulging. No middle ear effusion. No decreased hearing is noted.  Left Ear: Hearing, tympanic membrane, external ear and ear canal normal. No drainage, swelling or tenderness. No foreign bodies. Tympanic membrane is not erythematous and not bulging.  No middle ear effusion. No decreased hearing is noted.  Nose: Nose normal. No rhinorrhea. Right sinus exhibits no maxillary sinus tenderness and no frontal sinus tenderness. Left sinus exhibits no maxillary sinus tenderness and no frontal sinus tenderness.  Mouth/Throat: Uvula is midline, oropharynx is clear and moist and mucous membranes are normal. No oropharyngeal exudate, posterior oropharyngeal edema, posterior oropharyngeal erythema or tonsillar abscesses.  Eyes: Conjunctivae are normal.  Neck: Rigidity present.  Cardiovascular: Regular rhythm, normal heart sounds and normal pulses.  Tachycardia present.   Pulmonary/Chest: Effort normal and breath sounds normal. She has no wheezes. She has no rhonchi. She has no rales.  Lymphadenopathy:       Head (right side): No submental, no submandibular, no tonsillar, no preauricular, no posterior auricular and no occipital adenopathy present.       Head (left side): No submental, no submandibular, no tonsillar, no preauricular, no posterior auricular and no occipital adenopathy present.    She has no cervical adenopathy.  Neurological: She is alert.  Skin: Skin is warm and dry.  Psychiatric: She has a normal mood and affect. Her speech is normal and behavior is normal. Thought  content normal.  Vitals reviewed.      Assessment & Plan:   1. Tachycardia Due to rapid heart rate, fever, concern patient may need fluids and further observation. Of note, patient continued to describe neck stiffness and pain. Without access to pediatric immunizations, I do have concern for meningitis and furthermore felt more comfortable with patient going to the ED for fluids and observation.  2. Fever, unspecified fever cause Neg flu.    I am having Ms. Buechner maintain her etonogestrel-ethinyl estradiol, HYDROcodone-acetaminophen, fluticasone, metFORMIN, ranitidine, ibuprofen, Phenyleph-CPM-DM-APAP, oxyCODONE-acetaminophen, ondansetron, ciprofloxacin, and fluconazole.   No orders of the defined types were placed in this encounter.     Start medications as prescribed and explained to patient on After Visit Summary ( AVS). Risks, benefits, and alternatives of the medications and treatment plan prescribed today were discussed, and patient expressed understanding.   Education regarding symptom management and diagnosis given to patient.   Follow-up:Plan follow-up as discussed or as needed if any worsening symptoms or change in condition. No Follow-up on file.   Continue to follow with Myrlene Broker, MD for routine health maintenance.   Bridget Hinders and I agreed with plan.   Rennie Plowman, FNP

## 2015-09-13 NOTE — Telephone Encounter (Signed)
Patient Name: Bridget Fletcher  DOB: 09/18/1995    Initial Comment Caller states she has fever, headache, neck pain and stiffness.   Nurse Assessment  Nurse: Stefano GaulStringer, RN, Dwana CurdVera Date/Time (Eastern Time): 09/13/2015 7:37:17 AM  Confirm and document reason for call. If symptomatic, describe symptoms. You must click the next button to save text entered. ---Caller states she has body aches. Has sore throat. No thermometer. She is "burning up". she has chills also. She has a headache. She is coughing . She has had symptoms for over 2 days.  Has the patient traveled out of the country within the last 30 days? ---No  Does the patient have any new or worsening symptoms? ---Yes  Will a triage be completed? ---Yes  Related visit to physician within the last 2 weeks? ---No  Does the PT have any chronic conditions? (i.e. diabetes, asthma, etc.) ---Yes  List chronic conditions. ---prediabetes  Is the patient pregnant or possibly pregnant? (Ask all females between the ages of 4712-55) ---No  Is this a behavioral health or substance abuse call? ---No     Guidelines    Guideline Title Affirmed Question Affirmed Notes  Sore Throat SEVERE (e.g., excruciating) throat pain    Final Disposition User   See Physician within 24 Hours Stringer, RN, Dwana CurdVera    Comments  Pt states she has to have appt this am. [facility]. Appt scheduled at 9:45 am with Dr[facility] Berniece [facility]anda Panosh [facility].   Referrals  REFERRED TO PCP OFFICE   Disagree/Comply: Comply

## 2015-09-13 NOTE — ED Notes (Signed)
Unable to obtain blood cultures at this time due to MD performing lumbar puncture. Unable to administer antibiotic until blood cultures are obtained.

## 2015-09-13 NOTE — ED Notes (Addendum)
Pt sent by PCP. Pt reports fever for past 2.5 days accompanied by neck pain, HA, body aches and sore throat. PCP did not give any antipyretics during the office visit; has not had any antipyretics since yesterday. Pt reports the first symptom was a HA.

## 2015-09-13 NOTE — Patient Instructions (Signed)
Advised patient to go immediately to closest emergency department. Patient and I agreed with this plan due to urgent nature of symptoms.   Bridget Fletcher

## 2015-09-13 NOTE — ED Notes (Signed)
Gram stain: no organisms, white cells are present (poly and mononuclear) for tube 3.  Notified primary nurse

## 2015-09-13 NOTE — ED Provider Notes (Signed)
Patient signed out to me by Marlise EvesMohr, PA-C.  Patient with sore throat and neck stiffness.  No neck stiffness on exam.  Febrile and tachycardic.  Getting fluids.    LP performed by day team.  CSF cultures pending.  If negative DC to home with treatment for strep.  Patient feels improved.  CSF shows no organisms.  2 WBCs.  Will treat for strep with amox and DC to home.  Roxy Horsemanobert Otila Starn, PA-C 09/13/15 1758

## 2015-09-15 NOTE — ED Provider Notes (Signed)
  Physical Exam  BP 123/67 mmHg  Pulse 88  Temp(Src) 98.3 F (36.8 C) (Oral)  Resp 16  SpO2 97%  LMP 08/30/2015  Physical Exam Patient is alert and nontoxic. No respiratory distress. No meningismus. Airway patent without stridor. Mental status clear without neurologic deficit. ED Course  .Lumbar Puncture Date/Time: 09/15/2015 9:03 AM Performed by: Arby BarrettePFEIFFER, Halil Rentz Authorized by: Arby BarrettePFEIFFER, Jaydyn Menon Consent: Verbal consent obtained. Written consent obtained. Risks and benefits: risks, benefits and alternatives were discussed Consent given by: patient and parent Patient understanding: patient states understanding of the procedure being performed Patient consent: the patient's understanding of the procedure matches consent given Procedure consent: procedure consent matches procedure scheduled Relevant documents: relevant documents present and verified Test results: test results available and properly labeled Site marked: the operative site was marked Patient identity confirmed: verbally with patient Indications: evaluation for infection Anesthesia: local infiltration Local anesthetic: lidocaine 1% without epinephrine Anesthetic total: 8 ml Patient sedated: no Preparation: Patient was prepped and draped in the usual sterile fashion. Lumbar space: L4-L5 interspace Patient's position: sitting Needle gauge: 20 Needle type: diamond point Needle length: 5.0 in Number of attempts: 4 Fluid appearance: blood-tinged and clear Tubes of fluid: 4 Total volume: 4 ml Post-procedure: adhesive bandage applied Patient tolerance: Patient tolerated the procedure well with no immediate complications Comments: Attempt made with 3.5 inch needle. 3 attempts made and able to only reach interspace with complete needle insertion to depth but not into the spinal canal. Repeat attempt with 5 inch needle successful with return of first blood-tinged fluid then to clear. Patient tolerated well.    MDM Patient  is alert and nontoxic. Initial evaluation was for a rule out of meningitis for fever, headache and neck stiffness with referral by PCP. Patient was given Rocephin IV and lumbar puncture obtained. Patient's strep did return positive. Plan was for [facility]. If results within normal limits, plan to discharge with treatment for strep pharyngitis.    Medical screening examination/treatment/procedure(s) were conducted as a shared visit with non-physician practitioner(s) and myself.  I personally evaluated the patient during the encounter.   EKG Interpretation None     Patient has had 2 days of headache and fever. She describes these as being her predominant symptoms. Patient also later developed sore throat. She had been seen by her primary care physician today with concerns for possible meningitis.  Patient's mental status is clear. She is nontoxic. No respiratory distress. No upper airway stridor.  Separate complete note dictated by Rikki SpearingPA-C Mohr. Lumbar puncture procedure performed by myself    Arby BarretteMarcy Asiah Befort, MD 09/15/15 [phone]

## 2015-09-17 LAB — CSF CULTURE W GRAM STAIN: Culture: NO GROWTH

## 2015-09-18 LAB — CULTURE, BLOOD (ROUTINE X 2)
CULTURE: NO GROWTH
CULTURE: NO GROWTH

## 2016-05-06 ENCOUNTER — Other Ambulatory Visit (INDEPENDENT_AMBULATORY_CARE_PROVIDER_SITE_OTHER): Payer: Managed Care, Other (non HMO)

## 2016-05-06 ENCOUNTER — Ambulatory Visit (INDEPENDENT_AMBULATORY_CARE_PROVIDER_SITE_OTHER): Payer: Managed Care, Other (non HMO) | Admitting: Internal Medicine

## 2016-05-06 ENCOUNTER — Encounter: Payer: Self-pay | Admitting: Internal Medicine

## 2016-05-06 VITALS — BP 148/70 | HR 76 | Temp 97.9°F | Resp 16 | Ht 64.0 in | Wt 282.1 lb

## 2016-05-06 DIAGNOSIS — R202 Paresthesia of skin: Secondary | ICD-10-CM

## 2016-05-06 DIAGNOSIS — J209 Acute bronchitis, unspecified: Secondary | ICD-10-CM | POA: Insufficient documentation

## 2016-05-06 DIAGNOSIS — R200 Anesthesia of skin: Secondary | ICD-10-CM

## 2016-05-06 DIAGNOSIS — E669 Obesity, unspecified: Secondary | ICD-10-CM

## 2016-05-06 DIAGNOSIS — E1169 Type 2 diabetes mellitus with other specified complication: Secondary | ICD-10-CM

## 2016-05-06 LAB — FERRITIN: FERRITIN: 8.9 ng/mL — AB (ref 10.0–291.0)

## 2016-05-06 LAB — CBC
HCT: 36.7 % (ref 36.0–46.0)
Hemoglobin: 12.1 g/dL (ref 12.0–15.0)
MCHC: 32.9 g/dL (ref 30.0–36.0)
MCV: 77.1 fl — ABNORMAL LOW (ref 78.0–100.0)
PLATELETS: 379 10*3/uL (ref 150.0–400.0)
RBC: 4.76 Mil/uL (ref 3.87–5.11)
RDW: 14.5 % (ref 11.5–15.5)
WBC: 9.9 10*3/uL (ref 4.0–10.5)

## 2016-05-06 LAB — LIPID PANEL
Cholesterol: 176 mg/dL (ref 0–200)
HDL: 62.1 mg/dL (ref >39.00)
LDL Cholesterol: 87 mg/dL (ref 0–99)
NONHDL: 114.02
TRIGLYCERIDES: 136 mg/dL (ref 0.0–149.0)
Total CHOL/HDL Ratio: 3
VLDL: 27.2 mg/dL (ref 0.0–40.0)

## 2016-05-06 LAB — COMPREHENSIVE METABOLIC PANEL
ALT: 9 U/L (ref 0–35)
AST: 10 U/L (ref 0–37)
Albumin: 3.4 g/dL — ABNORMAL LOW (ref 3.5–5.2)
Alkaline Phosphatase: 73 U/L (ref 39–117)
BILIRUBIN TOTAL: 0.3 mg/dL (ref 0.2–1.2)
BUN: 7 mg/dL (ref 6–23)
CALCIUM: 8.8 mg/dL (ref 8.4–10.5)
CHLORIDE: 105 meq/L (ref 96–112)
CO2: 25 meq/L (ref 19–32)
CREATININE: 0.73 mg/dL (ref 0.40–1.20)
GFR: 127.68 mL/min (ref >60.00)
Glucose, Bld: 209 mg/dL — ABNORMAL HIGH (ref 70–99)
Potassium: 3.8 mEq/L (ref 3.5–5.1)
SODIUM: 138 meq/L (ref 135–145)
Total Protein: 6.8 g/dL (ref 6.0–8.3)

## 2016-05-06 LAB — TSH: TSH: 0.33 u[IU]/mL — AB (ref 0.35–4.50)

## 2016-05-06 LAB — VITAMIN B12: Vitamin B-12: 145 pg/mL — ABNORMAL LOW (ref 211–911)

## 2016-05-06 LAB — HEMOGLOBIN A1C: HEMOGLOBIN A1C: 6.2 % (ref 4.6–6.5)

## 2016-05-06 MED ORDER — GABAPENTIN 100 MG PO CAPS: 100.0000 mg | ORAL_CAPSULE | Freq: Three times a day (TID) | ORAL | 3 refills | Status: DC | PRN

## 2016-05-06 MED ORDER — PREDNISONE 20 MG PO TABS: 40.0000 mg | ORAL_TABLET | Freq: Every day | ORAL | 0 refills | Status: DC

## 2016-05-06 NOTE — Assessment & Plan Note (Signed)
Checking TSH, B12, vit D, CBC, CMP, HgA1c. Newly diagnosed diabetes and could be diabetes nerve damage. Also possible related to her back pains and nerves.

## 2016-05-06 NOTE — Assessment & Plan Note (Signed)
Rx for prednisone for the inflammation and SOB as well as rhonchi on exam.

## 2016-05-06 NOTE — Progress Notes (Signed)
Pre visit review using our clinic review tool, if applicable. No additional management support is needed unless otherwise documented below in the visit note. 

## 2016-05-06 NOTE — Progress Notes (Signed)
   Subjective:    Patient ID: Bridget Fletcher, female    DOB: 02/23/1996, 20 y.o.   MRN: [id number]  HPI The patient is a 20 YO female coming in for wellness but having acute concerns which are more important. She is having sinus pressure and congestion for several days. Denies fevers or chills. Some cough which is non-productive. Some ear pain but no discharge. Moderate sore throat. Is currently being treated for pink eye with drops but today is the last day and this is improving overall. She is not taking that medication as instructed. She is having cough and mild SOB. Overall worsening.  Another concern is some tingling and numbness in her leg which is new. She has had some burning in the leg ongoing for about 5 years now. She is having worsening problems so she is wondering if there is a cause. No difference in diet or exercise recently. Diagnosed as diabetic about 11 months ago and never returned for follow up. MRI of the low back was non-specific but had an episode of sciatica about 2 years ago.  She also is asking for follow up of her sugars. She never came back for follow up and did not start taking metformin as instructed. She is not having new numbness in her feet. No eye exam this year.   Review of Systems  Constitutional: Positive for activity change. Negative for appetite change, chills, fatigue, fever and unexpected weight change.  HENT: Positive for congestion, ear pain, rhinorrhea, sinus pain, sinus pressure and sore throat. Negative for ear discharge, postnasal drip and trouble swallowing.   Eyes: Positive for redness. Negative for pain, discharge and itching.  Respiratory: Positive for cough and shortness of breath. Negative for chest tightness.   Cardiovascular: Negative for chest pain, palpitations and leg swelling.  Gastrointestinal: Negative for abdominal distention, abdominal pain, constipation, diarrhea, nausea and vomiting.  Musculoskeletal: Negative for arthralgias, back pain  and myalgias.  Skin: Negative.   Neurological: Positive for numbness. Negative for dizziness, facial asymmetry, weakness and headaches.  Psychiatric/Behavioral: Negative.       Objective:   Physical Exam  Constitutional: She is oriented to person, place, and time. She appears well-developed and well-nourished.  Overweight  HENT:  Head: Normocephalic and atraumatic.  Right Ear: External ear normal.  Left Ear: External ear normal.  Oropharynx with redness and drainage, no nasal crusting, sinus pressure  Eyes: EOM are normal.  Neck:  Shotty LAD  Cardiovascular: Normal rate and regular rhythm.   Pulmonary/Chest: Effort normal. No respiratory distress. She has no wheezes. She has no rales.  Some coarse rhonchi which clears mostly with cough  Abdominal: Soft. She exhibits no distension. There is no tenderness. There is no rebound.  Musculoskeletal: She exhibits no edema.  Lymphadenopathy:    She has cervical adenopathy.  Neurological: She is alert and oriented to person, place, and time. A cranial nerve deficit is present.  Decrease sensation in the right thigh region, normal sensation in the feet and lower leg  Skin: Skin is warm and dry.   Vitals:   05/06/16 1508  BP: (!) 148/70  Pulse: 76  Resp: 16  Temp: 97.9 F (36.6 C)  TempSrc: Oral  Weight: 282 lb 1.9 oz (128 kg)  Height: 5\' 4"  (1.626 m)      Assessment & Plan:

## 2016-05-06 NOTE — Assessment & Plan Note (Signed)
Checking HgA1c, not taking any meds currently although advised to start metformin. Goal HgA1c <7. Foot exam done today.

## 2016-05-06 NOTE — Patient Instructions (Signed)
We have sent in the prednisone to help with the sinuses. Take 2 pills daily for 5 days then stop.  We are checking the labs today and will get back with you on the results.   We have sent in gabapentin for the burning pains in your leg that you can take 1 pill three times a day as needed.

## 2016-05-07 ENCOUNTER — Telehealth: Payer: Self-pay | Admitting: *Deleted

## 2016-05-07 LAB — VITAMIN D 25 HYDROXY (VIT D DEFICIENCY, FRACTURES): VITD: 9.56 ng/mL — AB (ref 30.00–100.00)

## 2016-05-07 MED ORDER — FLUCONAZOLE 150 MG PO TABS: 150.0000 mg | ORAL_TABLET | Freq: Once | ORAL | 1 refills | Status: AC

## 2016-05-07 NOTE — Telephone Encounter (Signed)
Pt left msg on triage stating saw MD yesterday, and forgot to ask for rx diflucan because she has now develop an yeast infection from it. Requesting rx to be called into [facility]. MD out of office pls advise...Raechel Chute/lmb

## 2016-05-07 NOTE — Telephone Encounter (Signed)
OK - Rx emailed Thx 

## 2016-05-08 NOTE — Telephone Encounter (Signed)
Notified pt diflucan has been sent to her pharmacy...Raechel Chute/lmb

## 2016-05-13 ENCOUNTER — Other Ambulatory Visit: Payer: Self-pay | Admitting: Internal Medicine

## 2016-05-13 MED ORDER — VITAMIN B-12 1000 MCG PO TABS: 1000.0000 ug | ORAL_TABLET | Freq: Every day | ORAL | 3 refills | Status: DC

## 2016-05-13 MED ORDER — VITAMIN D (ERGOCALCIFEROL) 1.25 MG (50000 UNIT) PO CAPS: 50000.0000 [IU] | ORAL_CAPSULE | ORAL | 0 refills | Status: DC

## 2016-05-26 ENCOUNTER — Telehealth: Payer: Self-pay | Admitting: Internal Medicine

## 2016-05-26 NOTE — Telephone Encounter (Signed)
TELEPHONE ADVICE RECORD [facility]  Patient Name: Bridget Fletcher  DOB: 08/25/1995    Initial Comment Caller states she has been having migraines and nausea for the last week. She was told it may be a side effect of her medication gabapentin.   Nurse Assessment  Nurse: Odis LusterBowers, RN, Bjorn Loserhonda Date/Time (Eastern Time): 05/26/2016 3:31:50 PM  Confirm and document reason for call. If symptomatic, describe symptoms. ---Caller states she has been having migraines and nausea for the last week. She was told it may be a side effect of her medication gabapentin. Reports that she has been taking medication for 2 weeks. Headaches began last week.  Does the patient have any new or worsening symptoms? ---Yes  Will a triage be completed? ---Yes  Related visit to physician within the last 2 weeks? ---Yes  Does the PT have any chronic conditions? (i.e. diabetes, asthma, etc.) ---No  Is the patient pregnant or possibly pregnant? (Ask all females between the ages of 5212-55) ---No  Is this a behavioral health or substance abuse call? ---No     Guidelines    Guideline Title Affirmed Question Affirmed Notes  Headache [1] MILD-MODERATE headache AND [2] present > 72 hours    Final Disposition User   See PCP When Office is Open (within 3 days) Odis LusterBowers, Charity fundraiserN, Bjorn Loserhonda    Comments  Attempted to schedule appt but patient states that she would like to wait ahile and see if her symptoms worsen. She will call back to schedule if so.   Referrals  GO TO FACILITY REFUSED   Disagree/Comply: Disagree  Disagree/Comply Reason: Wait and see

## 2016-05-26 NOTE — Telephone Encounter (Signed)
pts PCP is Dr Hillard DankerElizabeth Fletcher at [facility]; I called and spoke with Tammy  who advised me to send note to Shawnie Dapperamara Williams, nurse team lead.

## 2016-08-11 ENCOUNTER — Encounter: Payer: Self-pay | Admitting: Internal Medicine

## 2016-08-11 ENCOUNTER — Ambulatory Visit (INDEPENDENT_AMBULATORY_CARE_PROVIDER_SITE_OTHER): Payer: Managed Care, Other (non HMO) | Admitting: Internal Medicine

## 2016-08-11 ENCOUNTER — Other Ambulatory Visit (INDEPENDENT_AMBULATORY_CARE_PROVIDER_SITE_OTHER): Payer: Managed Care, Other (non HMO)

## 2016-08-11 DIAGNOSIS — E1169 Type 2 diabetes mellitus with other specified complication: Secondary | ICD-10-CM

## 2016-08-11 DIAGNOSIS — E559 Vitamin D deficiency, unspecified: Secondary | ICD-10-CM | POA: Diagnosis not present

## 2016-08-11 DIAGNOSIS — E538 Deficiency of other specified B group vitamins: Secondary | ICD-10-CM

## 2016-08-11 DIAGNOSIS — E669 Obesity, unspecified: Secondary | ICD-10-CM | POA: Diagnosis not present

## 2016-08-11 LAB — CBC
HEMATOCRIT: 38.2 % (ref 36.0–46.0)
HEMOGLOBIN: 12.3 g/dL (ref 12.0–15.0)
MCHC: 32.3 g/dL (ref 30.0–36.0)
MCV: 77 fl — ABNORMAL LOW (ref 78.0–100.0)
Platelets: 418 10*3/uL — ABNORMAL HIGH (ref 150.0–400.0)
RBC: 4.96 Mil/uL (ref 3.87–5.11)
RDW: 15.1 % (ref 11.5–15.5)
WBC: 7.9 10*3/uL (ref 4.0–10.5)

## 2016-08-11 LAB — COMPREHENSIVE METABOLIC PANEL
ALK PHOS: 66 U/L (ref 39–117)
ALT: 9 U/L (ref 0–35)
AST: 10 U/L (ref 0–37)
Albumin: 3.4 g/dL — ABNORMAL LOW (ref 3.5–5.2)
BUN: 6 mg/dL (ref 6–23)
CO2: 28 mEq/L (ref 19–32)
CREATININE: 0.75 mg/dL (ref 0.40–1.20)
Calcium: 8.9 mg/dL (ref 8.4–10.5)
Chloride: 104 mEq/L (ref 96–112)
GFR: 123.47 mL/min (ref >60.00)
GLUCOSE: 136 mg/dL — AB (ref 70–99)
POTASSIUM: 3.6 meq/L (ref 3.5–5.1)
SODIUM: 139 meq/L (ref 135–145)
TOTAL PROTEIN: 7.1 g/dL (ref 6.0–8.3)
Total Bilirubin: 0.3 mg/dL (ref 0.2–1.2)

## 2016-08-11 LAB — VITAMIN D 25 HYDROXY (VIT D DEFICIENCY, FRACTURES): VITD: 15.21 ng/mL — ABNORMAL LOW (ref 30.00–100.00)

## 2016-08-11 LAB — HEMOGLOBIN A1C: HEMOGLOBIN A1C: 6.4 % (ref 4.6–6.5)

## 2016-08-11 LAB — VITAMIN B12: Vitamin B-12: 227 pg/mL (ref 211–911)

## 2016-08-11 NOTE — Progress Notes (Signed)
   Subjective:    Patient ID: Bridget Fletcher, female    DOB: 04/27/1996, 21 y.o.   MRN: [id number]  HPI The patient is a 21 YO female with type 2 diabetes coming in for possible low sugars at home. She is not taking metformin for the sugars. No change in diet or exercise recently. Denies recent illness. She was sitting at work and felt somewhat lightheaded. She rested and ate something and then started feeling better. She denies excessive heat or cold, standing for a long time.   Review of Systems  Constitutional: Positive for activity change. Negative for appetite change, chills, fatigue, fever and unexpected weight change.  Respiratory: Negative.   Cardiovascular: Negative.   Gastrointestinal: Negative.   Musculoskeletal: Negative.   Neurological: Positive for dizziness and light-headedness. Negative for tremors, seizures, weakness, numbness and headaches.  Psychiatric/Behavioral: Negative.       Objective:   Physical Exam  Constitutional: She is oriented to person, place, and time. She appears well-developed and well-nourished.  Obese  HENT:  Head: Normocephalic and atraumatic.  Eyes: EOM are normal.  Cardiovascular: Normal rate and regular rhythm.   Pulmonary/Chest: Effort normal. No respiratory distress. She has no wheezes. She has no rales.  Abdominal: Soft. Bowel sounds are normal. She exhibits no distension. There is no tenderness. There is no rebound.  Musculoskeletal: She exhibits no edema.  Neurological: She is alert and oriented to person, place, and time.  Skin: Skin is warm and dry.   Vitals:   08/11/16 1615  BP: (!) 158/88  Pulse: (!) 110  Resp: 16  Temp: 98.3 F (36.8 C)  TempSrc: Oral  SpO2: 100%  Weight: 282 lb (127.9 kg)  Height: 5\' 4"  (1.626 m)      Assessment & Plan:

## 2016-08-11 NOTE — Patient Instructions (Signed)
We will check the labs today and send you the results.   We will send in a meter once you find out which one is covered.   For the sugar if you check and it is <80 or <70 this is considered a low blood sugar.   For the sugar if you check and it is >250 that is considered a high blood sugar.

## 2016-08-11 NOTE — Assessment & Plan Note (Signed)
Not taking metformin lately. Since checking labs for alternative reasons will check HgA1c to see if she needs to resume.

## 2016-08-11 NOTE — Assessment & Plan Note (Signed)
Took high dose replacement and needs levels rechecked today.

## 2016-08-11 NOTE — Progress Notes (Signed)
Pre visit review using our clinic review tool, if applicable. No additional management support is needed unless otherwise documented below in the visit note. 

## 2016-08-11 NOTE — Assessment & Plan Note (Signed)
Has been taking otc vitamin B12 daily and needs B12 today. If persistently low needs to start B12 injections.

## 2017-02-18 ENCOUNTER — Encounter: Payer: Self-pay | Admitting: Internal Medicine

## 2017-02-18 ENCOUNTER — Ambulatory Visit (INDEPENDENT_AMBULATORY_CARE_PROVIDER_SITE_OTHER): Payer: Managed Care, Other (non HMO) | Admitting: Internal Medicine

## 2017-02-18 VITALS — BP 124/78 | HR 80 | Temp 98.5°F | Ht 64.0 in | Wt 292.0 lb

## 2017-02-18 DIAGNOSIS — R202 Paresthesia of skin: Secondary | ICD-10-CM

## 2017-02-18 DIAGNOSIS — R200 Anesthesia of skin: Secondary | ICD-10-CM | POA: Diagnosis not present

## 2017-02-18 MED ORDER — ONDANSETRON HCL 4 MG PO TABS: 4.0000 mg | ORAL_TABLET | Freq: Three times a day (TID) | ORAL | 0 refills | Status: DC | PRN

## 2017-02-18 MED ORDER — VITAMIN D (ERGOCALCIFEROL) 1.25 MG (50000 UNIT) PO CAPS: 50000.0000 [IU] | ORAL_CAPSULE | ORAL | 0 refills | Status: DC

## 2017-02-18 MED ORDER — PREGABALIN 75 MG PO CAPS: 75.0000 mg | ORAL_CAPSULE | Freq: Two times a day (BID) | ORAL | 3 refills | Status: DC

## 2017-02-18 MED ORDER — VITAMIN B-12 1000 MCG PO TABS: 1000.0000 ug | ORAL_TABLET | Freq: Every day | ORAL | 3 refills | Status: DC

## 2017-02-18 NOTE — Patient Instructions (Signed)
We are getting you in to the neurologist for nerve testing.   We have given you the lyrica to try instead of gabapentin. We have also sent in the nausea medicine called zoftan which you can use up to three times a day as needed.

## 2017-02-20 NOTE — Assessment & Plan Note (Signed)
Referral to neurology and change gabapentin to lyrica for better control of pain. Note given for work to accommodate lack of ability to stand for prolonged periods.

## 2017-02-20 NOTE — Progress Notes (Signed)
   Subjective:    Patient ID: Bridget Fletcher, female    DOB: February 09, 1996, 21 y.o.   MRN: [id number]  HPI The patient is a 21 YO female coming in for numbness in her right leg which has been ongoing for the last several years. She is now having more pain with standing and is unable to stand for more than 1 hour. This is causing problems at her employment. She is using gabapentin which is not helpful and makes her feel nauseous. She denies new injury or overuse. Weight is up slightly. She denies weight loss. She denies loss of bowel or bladder.   Review of Systems  Constitutional: Positive for activity change. Negative for appetite change, fatigue, fever and unexpected weight change.  Respiratory: Negative for cough, chest tightness and shortness of breath.   Cardiovascular: Negative for chest pain, palpitations and leg swelling.  Gastrointestinal: Negative for abdominal distention, abdominal pain, constipation, diarrhea, nausea and vomiting.  Musculoskeletal: Positive for back pain. Negative for arthralgias, gait problem, joint swelling, myalgias, neck pain and neck stiffness.  Skin: Negative.   Neurological: Positive for numbness. Negative for dizziness, tremors, syncope, facial asymmetry, weakness and headaches.  Psychiatric/Behavioral: Negative.       Objective:   Physical Exam  Constitutional: She is oriented to person, place, and time. She appears well-developed and well-nourished.  Obese  HENT:  Head: Normocephalic and atraumatic.  Eyes: EOM are normal.  Neck: Normal range of motion.  Cardiovascular: Normal rate and regular rhythm.   Pulmonary/Chest: Effort normal and breath sounds normal. No respiratory distress. She has no wheezes. She has no rales.  Abdominal: Soft. Bowel sounds are normal. She exhibits no distension. There is no tenderness. There is no rebound.  Musculoskeletal: She exhibits tenderness. She exhibits no edema.  Pain in the lumbar region, numbness on the right thigh    Neurological: She is alert and oriented to person, place, and time. A cranial nerve deficit is present. Coordination normal.  Skin: Skin is warm and dry.   Vitals:   02/18/17 0830  BP: 124/78  Pulse: 80  Temp: 98.5 F (36.9 C)  TempSrc: Oral  Weight: 292 lb (132.5 kg)  Height:  (1.626 m)      Assessment & Plan:

## 2017-02-26 ENCOUNTER — Telehealth: Payer: Self-pay | Admitting: Internal Medicine

## 2017-02-26 DIAGNOSIS — M5431 Sciatica, right side: Secondary | ICD-10-CM

## 2017-02-26 DIAGNOSIS — M5432 Sciatica, left side: Principal | ICD-10-CM

## 2017-02-26 NOTE — Telephone Encounter (Signed)
Pt called and cannot get in to neurology until December or january  She is having sciatic flare ups and when she has these flare ups she cannot walk or work. She would like to be referred to pain management or somewhere to offer her immediate relief with shots.  Please advise

## 2017-02-26 NOTE — Telephone Encounter (Signed)
LVM informing patient and to call back with any questions

## 2017-02-26 NOTE — Telephone Encounter (Signed)
Pt informed and understood

## 2017-02-26 NOTE — Telephone Encounter (Signed)
Referral placed to neurosurgery which should be able to do that.

## 2017-02-26 NOTE — Telephone Encounter (Signed)
FYI:  Patient is having a hard time getting in with a neurologist in one week.

## 2017-03-10 LAB — HM PAP SMEAR

## 2017-03-18 ENCOUNTER — Other Ambulatory Visit: Payer: Self-pay

## 2017-03-23 MED ORDER — METFORMIN HCL 500 MG PO TABS
500.0000 mg | ORAL_TABLET | Freq: Every day | ORAL | 1 refills | Status: DC
Start: 2017-03-23 — End: 2017-07-27

## 2017-07-27 ENCOUNTER — Ambulatory Visit (INDEPENDENT_AMBULATORY_CARE_PROVIDER_SITE_OTHER): Payer: Managed Care, Other (non HMO) | Admitting: Internal Medicine

## 2017-07-27 ENCOUNTER — Encounter: Payer: Self-pay | Admitting: Internal Medicine

## 2017-07-27 DIAGNOSIS — R202 Paresthesia of skin: Secondary | ICD-10-CM

## 2017-07-27 DIAGNOSIS — E669 Obesity, unspecified: Secondary | ICD-10-CM | POA: Diagnosis not present

## 2017-07-27 DIAGNOSIS — R200 Anesthesia of skin: Secondary | ICD-10-CM

## 2017-07-27 DIAGNOSIS — E1169 Type 2 diabetes mellitus with other specified complication: Secondary | ICD-10-CM | POA: Diagnosis not present

## 2017-07-27 MED ORDER — PREGABALIN 75 MG PO CAPS: 75.0000 mg | ORAL_CAPSULE | Freq: Two times a day (BID) | ORAL | 3 refills | Status: DC

## 2017-07-27 MED ORDER — METFORMIN HCL 500 MG PO TABS: 500.0000 mg | ORAL_TABLET | Freq: Every day | ORAL | 1 refills | Status: DC

## 2017-07-27 MED ORDER — PHENTERMINE HCL 37.5 MG PO CAPS: 37.5000 mg | ORAL_CAPSULE | ORAL | 3 refills | Status: DC

## 2017-07-27 NOTE — Progress Notes (Signed)
   Subjective:    Patient ID: Bridget Fletcher, female    DOB: 01/19/1996, 22 y.o.   MRN: [id number]  HPI The patient is a 22 YO female coming in for HgA1c 8.7 at urgent care. She has been eating poorly lately and is ready to make a concerted effort at weight loss. She is wanting to do weight loss surgery as she is having a lot of problems from her weight including the new diabetes, back pain, knee pain. She is not exercising much due to severe back pain. She has seen the back specialist and they did injections in the back which were not helpful. She took lyrica but cannot recall if it worked. She is not currently taking it. She is out of metformin but took long ago for weight and pre-diabetes.   Review of Systems  Constitutional: Positive for activity change, fatigue and unexpected weight change. Negative for appetite change, chills and fever.  HENT: Negative.   Eyes: Negative.   Respiratory: Negative for cough, chest tightness and shortness of breath.   Cardiovascular: Negative for chest pain, palpitations and leg swelling.  Gastrointestinal: Negative for abdominal distention, abdominal pain, constipation, diarrhea, nausea and vomiting.  Musculoskeletal: Positive for arthralgias, back pain, gait problem and myalgias.  Skin: Negative.   Neurological: Negative for dizziness, tremors, weakness, light-headedness and headaches.  Psychiatric/Behavioral: Negative.       Objective:   Physical Exam  Constitutional: She is oriented to person, place, and time. She appears well-developed and well-nourished.  oveweight  HENT:  Head: Normocephalic and atraumatic.  Eyes: EOM are normal.  Neck: Normal range of motion.  Cardiovascular: Normal rate and regular rhythm.  Pulmonary/Chest: Effort normal and breath sounds normal. No respiratory distress. She has no wheezes. She has no rales.  Abdominal: Soft. Bowel sounds are normal. She exhibits no distension. There is no tenderness. There is no rebound.    Musculoskeletal: She exhibits no edema.  Neurological: She is alert and oriented to person, place, and time. Coordination abnormal.  Slow gait and slow to stand  Skin: Skin is warm and dry.  Psychiatric: She has a normal mood and affect.   Vitals:   07/27/17 1309  BP: 118/82  Pulse: 79  Temp: 98.7 F (37.1 C)  TempSrc: Oral  SpO2: 97%  Weight: 296 lb (134.3 kg)  Height: 5\' 4"  (1.626 m)      Assessment & Plan:

## 2017-07-27 NOTE — Patient Instructions (Addendum)
We have given you the prescription for the metformin and the phentermine. Take 1 pill daily of these to help with weight loss and sugars.  We have also given you the medicine lyrica to use for pain. The first 2 days take 1 pill twice a day. Then increase to 2 pills twice a day for pain. Let us know if it is working. If so we can increase the dose to make it more convenient.   We will get you in with the surgeon.  Diabetes Mellitus and Nutrition When you have diabetes (diabetes mellitus), it is very important to have healthy eating habits because your blood sugar (glucose) levels are greatly affected by what you eat and drink. Eating healthy foods in the appropriate amounts, at about the same times every day, can help you:  Control your blood glucose.  Lower your risk of heart disease.  Improve your blood pressure.  Reach or maintain a healthy weight.  Every person with diabetes is different, and each person has different needs for a meal plan. Your health care provider may recommend that you work with a diet and nutrition specialist (dietitian) to make a meal plan that is best for you. Your meal plan may vary depending on factors such as:  The calories you need.  The medicines you take.  Your weight.  Your blood glucose, blood pressure, and cholesterol levels.  Your activity level.  Other health conditions you have, such as heart or kidney disease.  How do carbohydrates affect me? Carbohydrates affect your blood glucose level more than any other type of food. Eating carbohydrates naturally increases the amount of glucose in your blood. Carbohydrate counting is a method for keeping track of how many carbohydrates you eat. Counting carbohydrates is important to keep your blood glucose at a healthy level, especially if you use insulin or take certain oral diabetes medicines. It is important to know how many carbohydrates you can safely have in each meal. This is different for every  person. Your dietitian can help you calculate how many carbohydrates you should have at each meal and for snack. Foods that contain carbohydrates include:  Bread, cereal, rice, pasta, and crackers.  Potatoes and corn.  Peas, beans, and lentils.  Milk and yogurt.  Fruit and juice.  Desserts, such as cakes, cookies, ice cream, and candy.  How does alcohol affect me? Alcohol can cause a sudden decrease in blood glucose (hypoglycemia), especially if you use insulin or take certain oral diabetes medicines. Hypoglycemia can be a life-threatening condition. Symptoms of hypoglycemia (sleepiness, dizziness, and confusion) are similar to symptoms of having too much alcohol. If your health care provider says that alcohol is safe for you, follow these guidelines:  Limit alcohol intake to no more than 1 drink per day for nonpregnant women and 2 drinks per day for men. One drink equals 12 oz of beer, 5 oz of wine, or 1 oz of hard liquor.  Do not drink on an empty stomach.  Keep yourself hydrated with water, diet soda, or unsweetened iced tea.  Keep in mind that regular soda, juice, and other mixers may contain a lot of sugar and must be counted as carbohydrates.  What are tips for following this plan? Reading food labels  Start by checking the serving size on the label. The amount of calories, carbohydrates, fats, and other nutrients listed on the label are based on one serving of the food. Many foods contain more than one serving per package.  Check  the total grams (g) of carbohydrates in one serving. You can calculate the number of servings of carbohydrates in one serving by dividing the total carbohydrates by 15. For example, if a food has 30 g of total carbohydrates, it would be equal to 2 servings of carbohydrates.  Check the number of grams (g) of saturated and trans fats in one serving. Choose foods that have low or no amount of these fats.  Check the number of milligrams (mg) of sodium  in one serving. Most people should limit total sodium intake to less than 2,300 mg per day.  Always check the nutrition information of foods labeled as "low-fat" or "nonfat". These foods may be higher in added sugar or refined carbohydrates and should be avoided.  Talk to your dietitian to identify your daily goals for nutrients listed on the label. Shopping  Avoid buying canned, premade, or processed foods. These foods tend to be high in fat, sodium, and added sugar.  Shop around the outside edge of the grocery store. This includes fresh fruits and vegetables, bulk grains, fresh meats, and fresh dairy. Cooking  Use low-heat cooking methods, such as baking, instead of high-heat cooking methods like deep frying.  Cook using healthy oils, such as olive, canola, or sunflower oil.  Avoid cooking with butter, cream, or high-fat meats. Meal planning  Eat meals and snacks regularly, preferably at the same times every day. Avoid going long periods of time without eating.  Eat foods high in fiber, such as fresh fruits, vegetables, beans, and whole grains. Talk to your dietitian about how many servings of carbohydrates you can eat at each meal.  Eat 4-6 ounces of lean protein each day, such as lean meat, chicken, fish, eggs, or tofu. 1 ounce is equal to 1 ounce of meat, chicken, or fish, 1 egg, or 1/4 cup of tofu.  Eat some foods each day that contain healthy fats, such as avocado, nuts, seeds, and fish. Lifestyle   Check your blood glucose regularly.  Exercise at least 30 minutes 5 or more days each week, or as told by your health care provider.  Take medicines as told by your health care provider.  Do not use any products that contain nicotine or tobacco, such as cigarettes and e-cigarettes. If you need help quitting, ask your health care provider.  Work with a Veterinary surgeon or diabetes educator to identify strategies to manage stress and any emotional and social challenges. What are some  questions to ask my health care provider?  Do I need to meet with a diabetes educator?  Do I need to meet with a dietitian?  What number can I call if I have questions?  When are the best times to check my blood glucose? Where to find more information:  American Diabetes Association: diabetes.org/food-and-fitness/food  Academy of Nutrition and Dietetics: https://www.vargas.com/  [employer] of Diabetes and Digestive and Kidney Diseases (NIH): FindJewelers.cz Summary  A healthy meal plan will help you control your blood glucose and maintain a healthy lifestyle.  Working with a diet and nutrition specialist (dietitian) can help you make a meal plan that is best for you.  Keep in mind that carbohydrates and alcohol have immediate effects on your blood glucose levels. It is important to count carbohydrates and to use alcohol carefully. This information is not intended to replace advice given to you by your health care provider. Make sure you discuss any questions you have with your health care provider. Document Released: 01/30/2005 Document Revised: 06/09/2016  Document Reviewed: 06/09/2016 Elsevier Interactive Patient Education  [employer].

## 2017-07-28 NOTE — Assessment & Plan Note (Signed)
Rx for lyrica 75 mg to take 1 pill BID for 3 days then increase to 2 pills BID for pain relief. Adjust as needed. She has had back injections without long term relief. Only lasted for about 1 week.

## 2017-07-28 NOTE — Assessment & Plan Note (Signed)
Not taking anything now. Rx for metformin daily and recent HgA1c 8.7. She wants to work on weight loss. Working on treating that and will adjust as needed. Back in 3 months. She is given dietary changes information today.

## 2017-07-28 NOTE — Assessment & Plan Note (Signed)
Complicated by fatty liver, diabetes, back pain, osteoarthritis. She wants to try weight loss procedure to help accelerate her loss. Rx for phentermine and return in 3 months for monitoring.

## 2017-07-29 ENCOUNTER — Telehealth: Payer: Self-pay | Admitting: Internal Medicine

## 2017-07-29 NOTE — Telephone Encounter (Signed)
Can I send. Please advise

## 2017-07-29 NOTE — Telephone Encounter (Signed)
Copied from CRM [id number]. Topic: Quick Communication - Rx Refill/Question >> Jul 29, 2017  9:31 AM Crist InfanteHarrald, Kathy J wrote: Medication: metFORMIN (GLUCOPHAGE) 500 MG tablet                     phentermine 37.5 MG capsule                     pregabalin (LYRICA) 75 MG capsule  Pt saw the dr on 3/11 and was given a paper scripts, but [facility], (cheaper for her) 90 day refill

## 2017-07-30 MED ORDER — PHENTERMINE HCL 37.5 MG PO CAPS: 37.5000 mg | ORAL_CAPSULE | ORAL | 0 refills | Status: DC

## 2017-07-30 MED ORDER — PREGABALIN 75 MG PO CAPS: 75.0000 mg | ORAL_CAPSULE | Freq: Two times a day (BID) | ORAL | 0 refills | Status: DC

## 2017-07-30 MED ORDER — METFORMIN HCL 500 MG PO TABS: 500.0000 mg | ORAL_TABLET | Freq: Every day | ORAL | 3 refills | Status: DC

## 2017-07-30 NOTE — Telephone Encounter (Signed)
[facility]

## 2017-08-06 ENCOUNTER — Telehealth: Payer: Self-pay

## 2017-08-06 NOTE — Telephone Encounter (Signed)
Attempted to do prior-auth under key [id number] on Cover my meds but message came up:  PA was already submitted for this patient and drug which was denied.;ZOXWRU:[id number];XBJYNW:GNFAOZCaseId:[id number];Status:Denied;Appeal Information: Attention:ATTN: CLINICAL APPEALS DEPARTMENT [facility] [phone],[phone] Phone:[phone] Fax:[phone]

## 2017-08-10 ENCOUNTER — Other Ambulatory Visit: Payer: Self-pay | Admitting: Internal Medicine

## 2017-08-10 MED ORDER — PHENTERMINE HCL 37.5 MG PO CAPS: 37.5000 mg | ORAL_CAPSULE | ORAL | 0 refills | Status: DC

## 2017-08-10 NOTE — Telephone Encounter (Signed)
Original Prescription was sent to [facility] on 07/30/17.

## 2017-08-10 NOTE — Telephone Encounter (Signed)
Copied from CRM [id number]. Topic: Quick Communication - See Telephone Encounter >> Aug 10, 2017 10:37 AM Arlyss Gandyichardson, Bridget Fletcher, NT wrote: CRM for notification. See Telephone encounter for: 08/10/17. Pt wants to see if the phentermine 37.5 MG capsule can be resent. [facility] state they did not receive the medication.

## 2017-08-10 NOTE — Addendum Note (Signed)
Addended by: Berton LanGULCH, Karmyn Lowman R on: 08/10/2017 02:56 PM   Modules accepted: Orders

## 2017-08-14 NOTE — Telephone Encounter (Signed)
Pt called b/c pt states she called her pharmacy on Wednesday and states they have not received a refill request for phentermine 37.5 MG capsule [[id number]][[id number]]  Contact pharmacy or pt to advise

## 2017-08-14 NOTE — Telephone Encounter (Signed)
PA approved 07/15/2017-11/12/2017

## 2017-08-14 NOTE — Telephone Encounter (Signed)
TC to patient. She stated her [facility] have yet to receive Phentermine request.  TC to [facility]-asked for reason the medication was denied. It was stated that the non-clinical criteria questions were not answered on the prior approval.  UE#[id number] Approval received good from 07/15/17 through 11/12/17. TC to inform patient.

## 2017-09-10 ENCOUNTER — Encounter: Payer: Self-pay | Admitting: Internal Medicine

## 2017-09-11 ENCOUNTER — Ambulatory Visit: Payer: Self-pay | Admitting: *Deleted

## 2017-09-11 MED ORDER — LIRAGLUTIDE -WEIGHT MANAGEMENT 18 MG/3ML ~~LOC~~ SOPN: PEN_INJECTOR | SUBCUTANEOUS | 0 refills | Status: AC

## 2017-09-11 MED ORDER — LIRAGLUTIDE -WEIGHT MANAGEMENT 18 MG/3ML ~~LOC~~ SOPN: 3.0000 mg | PEN_INJECTOR | Freq: Every day | SUBCUTANEOUS | 11 refills | Status: DC

## 2017-09-11 NOTE — Telephone Encounter (Signed)
Pt has been taking Phentermine for about 3  Weeks  And having periods of heart beating fast and some anxious  feelings at times. Denies any chest pain or shortness of breath. Sent Dr Okey Duprerawford e mail recently  about possible alternative therapy.Patient is in [place] today. She declines an appointment - She  Was  Advised to stop the Phentermine until PCP advice . If symptoms become severe or any chest pain seek immediate attention.  Answer Assessment - Initial Assessment Questions 1. DESCRIPTION: "Please describe your heart rate or heart beat that you are having" (e.g., fast/slow, regular/irregular, skipped or extra beats, "palpitations")      Beat fast   2. ONSET: "When did it start?" (Minutes, hours or days)        3   Weeks   3. DURATION: "How long does it last" (e.g., seconds, minutes, hours)        Last  For  Few  Hours      4. PATTERN "Does it come and go, or has it been constant since it started?"  "Does it get worse with exertion?"   "Are you feeling it now?"         Taking  Phentermine  approx 9  Am  Each  Am  Started  Feeling the  Tachycardia  approx 4  Hours after  And  It  Lasts  sev  Hours     5. TAP: "Using your hand, can you tap out what you are feeling on a chair or table in front of you, so that I can hear?" (Note: not all patients can do this)         Feels  Regular   6. HEART RATE: "Can you tell me your heart rate?" "How many beats in 15 seconds?"  (Note: not all patients can do this)       100   7. RECURRENT SYMPTOM: "Have you ever had this before?" If so, ask: "When was the last time?" and "What happened that time?"      approx  3  Weeks    8. CAUSE: "What do you think is causing the palpitations?"      Phentermine   9. CARDIAC HISTORY: "Do you have any history of heart disease?" (e.g., heart attack, angina, bypass surgery, angioplasty, arrhythmia)        no 10. OTHER SYMPTOMS: "Do you have any other symptoms?" (e.g., dizziness, chest pain, sweating, difficulty breathing)      Jittery   Fatigue  Worse  At  Night     11. PREGNANCY: "Is there any chance you are pregnant?" "When was your last menstrual period?"       26  March  Protocols used: HEART RATE AND HEARTBEAT QUESTIONS-A-AH

## 2017-09-14 ENCOUNTER — Telehealth: Payer: Self-pay | Admitting: Internal Medicine

## 2017-09-14 NOTE — Telephone Encounter (Signed)
Answered patient through Mychart, will call patient once done with PA that I told her I would get to this week and could take up to 5-7 business days to get done and go through insurance.

## 2017-09-14 NOTE — Telephone Encounter (Signed)
Copied from CRM [id number]. Topic: Quick Communication - See Telephone Encounter >> Sep 14, 2017 11:32 AM Lorrine Kin, NT wrote: CRM for notification. See Telephone encounter for: 09/14/17. Patient states that she heard you had to stop taking metformin when taking Saxenda. She wants to know if Dr Okey Dupre is aware of this and what does she want her to do? She also wants to know if it is okay to take her nausea medication with the Saxenda? States she was told that the Saxenda can make you nauseous. CB#: [phone]

## 2017-09-14 NOTE — Telephone Encounter (Signed)
Copied from CRM [id number]. Topic: Quick Communication - See Telephone Encounter >> Sep 14, 2017 11:38 AM Lorrine Kin, NT wrote: CRM for notification. See Telephone encounter for: 09/14/17. Patient calling and states she would like a call when her PA for Bernie Covey is completed and would like any updates so she can follow up with her pharmacy. Please advise. CB#:[phone]

## 2017-09-14 NOTE — Telephone Encounter (Signed)
It is okay to continue metformin and use nausea medicine. If she has problems let us know.

## 2017-09-14 NOTE — Telephone Encounter (Signed)
LVM informing patient of MD response  

## 2017-09-16 ENCOUNTER — Telehealth: Payer: Self-pay

## 2017-09-16 MED ORDER — INSULIN PEN NEEDLE 32G X 6 MM MISC: 1 refills | Status: AC

## 2017-09-16 NOTE — Telephone Encounter (Signed)
PA approved Start Date:08/17/2017;Coverage End Date:01/14/2018; Tried calling pharmacy but phone kept ringing Patient informed through [facility]

## 2017-09-16 NOTE — Telephone Encounter (Signed)
Copied from CRM [id number]. Topic: Quick Communication - See Telephone Encounter >> Sep 14, 2017 11:38 AM Lorrine Kin, NT wrote: CRM for notification. See Telephone encounter for: 09/14/17. Patient calling and states she would like a call when her PA for Bernie Covey is completed and would like any updates. Please advise. CB#:[phone] >> Sep 16, 2017  2:18 PM Terisa Starr wrote: Patient said she needs the needles sent in for the saxenda as well

## 2017-10-14 ENCOUNTER — Telehealth: Payer: Self-pay | Admitting: Internal Medicine

## 2017-10-14 NOTE — Telephone Encounter (Signed)
Refill on medication called in 09/11/17. Pt. Notified.

## 2017-10-14 NOTE — Telephone Encounter (Signed)
Copied from CRM [id number]. Topic: Quick Communication - See Telephone Encounter >> Oct 14, 2017  9:35 AM Windy Kalata, NT wrote: CRM for notification. See Telephone encounter for: 10/14/17.  Patient is calling and states that she needs a refill on Liraglutide -Weight Management (SAXENDA) 18 MG/3ML SOPN. She states she runs out on 10/17/17. She has a appt on 11/16/17. Please advise.   [facility] - [place], [place] - [address] [place] [address] Phone: [phone] Fax: [phone]

## 2017-11-02 ENCOUNTER — Ambulatory Visit: Payer: Managed Care, Other (non HMO) | Admitting: Internal Medicine

## 2017-11-16 ENCOUNTER — Ambulatory Visit: Payer: Managed Care, Other (non HMO) | Admitting: Internal Medicine

## 2017-11-17 ENCOUNTER — Encounter: Payer: Self-pay | Admitting: Internal Medicine

## 2017-11-17 ENCOUNTER — Ambulatory Visit: Payer: Managed Care, Other (non HMO) | Admitting: Internal Medicine

## 2017-11-17 ENCOUNTER — Ambulatory Visit (INDEPENDENT_AMBULATORY_CARE_PROVIDER_SITE_OTHER): Payer: Managed Care, Other (non HMO) | Admitting: Internal Medicine

## 2017-11-17 VITALS — BP 130/90 | HR 95 | Temp 98.9°F | Ht 64.0 in | Wt 283.0 lb

## 2017-11-17 DIAGNOSIS — K760 Fatty (change of) liver, not elsewhere classified: Secondary | ICD-10-CM

## 2017-11-17 DIAGNOSIS — M5441 Lumbago with sciatica, right side: Secondary | ICD-10-CM | POA: Insufficient documentation

## 2017-11-17 NOTE — Assessment & Plan Note (Signed)
Referral to PT as neurosurgery was not helpful and the epidural injections were not helpful. She is working on weight loss and is down about 20 pounds so far.

## 2017-11-17 NOTE — Assessment & Plan Note (Signed)
Continue saxenda and she has lost about 20 pounds so far and would like to continue with this.

## 2017-11-17 NOTE — Progress Notes (Signed)
   Subjective:    Patient ID: Bridget Fletcher, female    DOB: 09/21/1995, 22 y.o.   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   Review of Systems  Constitutional: Positive for activity change. Negative for appetite change, chills, fatigue, fever and unexpected weight change.  HENT: Negative.   Eyes: Negative.   Respiratory: Negative.  Negative for cough, chest tightness and shortness of breath.   Cardiovascular: Negative.  Negative for chest pain, palpitations and leg swelling.  Gastrointestinal: Negative.  Negative for abdominal distention, abdominal pain, constipation, diarrhea, nausea and vomiting.  Musculoskeletal: Positive for arthralgias, back pain and myalgias. Negative for gait problem and joint swelling.  Skin: Negative.   Neurological: Positive for numbness. Negative for dizziness, tremors, syncope, facial asymmetry and weakness.  Psychiatric/Behavioral: Negative.       Objective:   Physical Exam  Constitutional: She is oriented to person, place, and time. She appears well-developed and well-nourished.  HENT:  Head: Normocephalic and atraumatic.  Eyes: EOM are normal.  Neck: Normal range of motion.  Cardiovascular: Normal rate and regular rhythm.  Pulmonary/Chest: Effort normal and breath sounds normal. No respiratory distress. She has no wheezes. She has no rales.  Abdominal: Soft. She exhibits no distension. There is no tenderness. There is no rebound.  Musculoskeletal: She exhibits tenderness. She exhibits no edema.  Neurological: She is alert and oriented to  person, place, and time. Coordination normal.  Skin: Skin is warm and dry.  Psychiatric: She has a normal mood and affect.   Vitals:   11/17/17 0901  BP: 130/90  Pulse: 95  Temp: 98.9 F (37.2 C)  TempSrc: Oral  SpO2: 98%  Weight: 283 lb (128.4 kg)  Height: 5\' 4"  (1.626 m)      Assessment & Plan:

## 2017-11-17 NOTE — Patient Instructions (Signed)
We will get you in to physical therapy to help the back.

## 2017-11-17 NOTE — Assessment & Plan Note (Signed)
Working on weight loss and down about 20 pounds to help with this. LFTs have been stable.

## 2017-11-20 ENCOUNTER — Encounter: Payer: Self-pay | Admitting: Internal Medicine

## 2017-11-20 NOTE — Progress Notes (Signed)
Abstracted and sent to scan  

## 2018-01-01 ENCOUNTER — Ambulatory Visit: Payer: Managed Care, Other (non HMO) | Admitting: Physical Therapy

## 2018-01-01 ENCOUNTER — Encounter: Payer: Self-pay | Admitting: Physical Therapy

## 2018-01-01 DIAGNOSIS — M5441 Lumbago with sciatica, right side: Secondary | ICD-10-CM | POA: Diagnosis not present

## 2018-01-01 NOTE — Patient Instructions (Signed)

## 2018-01-01 NOTE — Therapy (Signed)
[facility]: [facility] [address] [place], [place], [address] Phone: [phone]   Fax:  [phone]  Physical Therapy Evaluation  Patient Details  Name: Bridget Fletcher MRN: [id number] Date of Birth: 01/08/1996 Referring Provider: Hillard DankerElizabeth Crawford MD   Encounter Date: 01/01/2018  PT End of Session - 01/01/18 1051    Visit Number  1    Number of Visits  12    Date for PT Re-Evaluation  02/12/18    PT Start Time  0810    PT Stop Time  0855    PT Time Calculation (min)  45 min    Activity Tolerance  Patient tolerated treatment well    Behavior During Therapy  [facility] for tasks assessed/performed       Past Medical History:  Diagnosis Date  . Diabetes mellitus without complication (HCC)   . E. coli UTI 07/29/15   [facility] ; sensitive to Cipro  . Gall stones   . GERD (gastroesophageal reflux disease)     Past Surgical History:  Procedure Laterality Date  . CHOLECYSTECTOMY N/A 07/06/2015   Procedure: LAPAROSCOPIC CHOLECYSTECTOMY WITH INTRAOPERATIVE CHOLANGIOGRAM;  Surgeon: Avel Peaceodd Rosenbower, MD;  Location: [facility];  Service: General;  Laterality: N/A;  . TONSILLECTOMY AND ADENOIDECTOMY      There were no vitals filed for this visit.   Subjective Assessment - 01/01/18 1039    Subjective  Pt reports LBP with Rt sciatica since 2015 that flared up 2 weeks ago prompting referral to PT. She also relays Meralgia paresthetica in Rt thigh. 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  Yes    Pain Score  5     Pain Location  Back    Pain Orientation  Right    Pain Descriptors / Indicators  Pins and needles    Pain Type  Chronic pain    Pain Radiating Towards  Rt buttock and thigh down to knee    Pain Onset  More than a month ago    Pain Frequency  Intermittent    Pain Relieving Factors  nothing         [facility] PT Assessment - 01/01/18 0001      Assessment   Medical Diagnosis  LBP with Rt sciatica    Referring Provider  Hillard DankerElizabeth Crawford MD    Prior Therapy  PT for knee pain      Precautions   Precautions  None      Balance Screen   Has the patient fallen in the past 6 months  No      Prior Function   Level of Independence  Independent      Cognition   Overall Cognitive Status  Within Functional Limits for tasks assessed      Sensation   Light Touch  Appears Intact      Posture/Postural Control   Posture Comments  anterior pelvic tilt      ROM / Strength   AROM / PROM / Strength  AROM;Strength      AROM   Overall AROM Comments  lumbar WNL except extension 50%  Strength   Overall Strength  Within functional limits for tasks performed    Overall Strength Comments  LE bilat      Flexibility   Soft Tissue Assessment /Muscle Length  --   tight hip flexors bilat     Palpation   Spinal mobility  WNL    Palpation comment  TTP L5-S2, and glutes, piriformis/IT band      Special Tests   Other special tests  neg for slump, SLR, FABERs      Ambulation/Gait   Gait Comments  WFL                Objective measurements completed on examination: See above findings.      [facility] Adult PT Treatment/Exercise - 01/01/18 0001      Self-Care   Self-Care  --   HEP, TENS     Modalities   Modalities  Moist Heat;Electrical Stimulation      Moist Heat Therapy   Number Minutes Moist Heat  15 Minutes    Moist Heat Location  Lumbar Spine      Electrical Stimulation   Electrical Stimulation Location  lumbar/Rt glutes    Electrical Stimulation Action  IFC     Electrical Stimulation Parameters  tolerance 15 min    Electrical Stimulation Goals  Pain      Manual  Therapy   Manual therapy comments  T.P relase to Rt glutes/piriformis, LE distraction             PT Education - 01/01/18 1051    Education Details  HEP, TENS    Person(s) Educated  Patient    Methods  Explanation;Demonstration;Verbal cues;Handout    Comprehension  Verbalized understanding;Need further instruction          PT Long Term Goals - 01/01/18 1056      PT LONG TERM GOAL #1   Title  Pt will be I and compliant with HEP. 6 weeks 02/12/18    Status  New      PT LONG TERM GOAL #2   Title  Pt will report overall 90% reduction in pain and radiculopathy. 6 weeks 02/12/18    Status  New      PT LONG TERM GOAL #3   Title  Pt will be able to walk at least 30 min without increased pain. 6 weeks 02/12/18    Status  New      PT LONG TERM GOAL #4   Title  Pt will be able to drive at least an hour without increased pain. 6 weeks 02/12/18    Status  New             Plan - 01/01/18 1052    Clinical Impression Statement  Pt presents with Rt LBP with radiculopathy down Rt LE and piriformis syndrome. She has severe anterior pelvic tilt thus preferred flexion based positions today. She has decreased ROM, decreased core strength, decreased flexabilty in glutes/piriformis/hip flexors, and increased pain all limiiting her funcitonal abiltites. She will benefit from skilled PT to address these deficits.    History and Personal Factors relevant to plan of care:  DM, obesity, posture, physical nature of her job    Clinical Presentation  Stable    Clinical Decision Making  Moderate    Rehab Potential  Good    PT Frequency  2x / week    PT Duration  6 weeks    PT Treatment/Interventions  ADLs/Self Care Home Management;Cryotherapy;Electrical Stimulation;Iontophoresis 4mg /ml Dexamethasone;Moist Heat;Traction;Ultrasound;Therapeutic exercise;Therapeutic activities;Neuromuscular  re-education;Manual techniques;Passive range of motion;Dry needling;Taping    PT Next Visit Plan  review HEP,  MT, modalities PRN       Patient will benefit from skilled therapeutic intervention in order to improve the following deficits and impairments:  Decreased activity tolerance, Decreased range of motion, Decreased strength, Impaired flexibility, Difficulty walking, Obesity, Postural dysfunction, Pain, Increased muscle spasms  Visit Diagnosis: Acute bilateral low back pain with right-sided sciatica     Problem List Patient Active Problem List   Diagnosis Date Noted  . Acute bilateral low back pain with right-sided sciatica 11/17/2017  . B12 deficiency 08/11/2016  . Vitamin D deficiency 08/11/2016  . Numbness and tingling of right leg 05/06/2016  . Cholelithiasis 05/23/2015  . Morbid obesity (HCC) 05/23/2015  . Fatty liver 05/23/2015  . Diabetes mellitus type 2 in obese [facility]) 05/23/2015    April Manson, PT, DPT 01/01/2018, 10:58 AM  [facility] [address] [place], [place], [address] Phone: [phone]   Fax:  [phone]  Name: Bridget Fletcher MRN: [id number] Date of Birth: 05/02/1996

## 2018-01-04 ENCOUNTER — Ambulatory Visit: Payer: Managed Care, Other (non HMO) | Admitting: Physical Therapy

## 2018-01-04 DIAGNOSIS — M5441 Lumbago with sciatica, right side: Secondary | ICD-10-CM | POA: Diagnosis not present

## 2018-01-04 NOTE — Therapy (Signed)
[facility] [address] [place], [place], [address] Phone: [phone]   Fax:  [phone]  Physical Therapy Treatment  Patient Details  Name: Bridget Fletcher MRN: [id number] Date of Birth: 02/12/1996 Referring Provider: Hillard DankerElizabeth Crawford MD   Encounter Date: 01/04/2018  PT End of Session - 01/04/18 0900    Visit Number  2    Number of Visits  12    Date for PT Re-Evaluation  02/12/18    PT Start Time  0810    PT Stop Time  0905    PT Time Calculation (min)  55 min    Activity Tolerance  Patient tolerated treatment well    Behavior During Therapy  [facility] for tasks assessed/performed       Past Medical History:  Diagnosis Date  . Diabetes mellitus without complication (HCC)   . E. coli UTI 07/29/15   [facility] ; sensitive to Cipro  . Gall stones   . GERD (gastroesophageal reflux disease)     Past Surgical History:  Procedure Laterality Date  . CHOLECYSTECTOMY N/A 07/06/2015   Procedure: LAPAROSCOPIC CHOLECYSTECTOMY WITH INTRAOPERATIVE CHOLANGIOGRAM;  Surgeon: Avel Peaceodd Rosenbower, MD;  Location[facility];  Service: General;  Laterality: N/A;  . TONSILLECTOMY AND ADENOIDECTOMY      There were no vitals filed for this visit.  Subjective Assessment - 01/04/18 0855    Subjective  Pt relays she had a bad day yesterday with Rt leg pain and numbness after standing up more at a social event and then walking around [employer].     Pertinent History  DM, obesity    Currently in Pain?  Yes    Pain Score  5     Pain Location  Hip    Pain Orientation  Right    Pain Descriptors / Indicators  Pins and needles    Pain Type  Chronic pain    Pain Onset  More than a month ago                       [facility]/Ex[facility] - 01/04/18 0001      Exercises   Exercises  Lumbar      Lumbar Exercises: Stretches   Double Knee to Chest Stretch  3 reps;30 seconds    Hip Flexor Stretch  3 reps;30 seconds    supine with strap   Piriformis Stretch  30 seconds;3 reps      Lumbar Exercises: Aerobic   Stationary Bike  5 min warmup      Lumbar Exercises: Supine   Ab Set  5 reps;5 seconds    Pelvic Tilt  20 reps;5 seconds      Modalities   Modalities  Moist Heat;Electrical Stimulation      Moist Heat Therapy   Number Minutes Moist Heat  15 Minutes    Moist Heat Location  Lumbar Spine      Electrical Stimulation   Electrical Stimulation Location  lumbar/Rt glutes    Electrical Stimulation Action  IFC    Electrical Stimulation Parameters  tolerance 15 min    Electrical Stimulation Goals  Pain      Manual Therapy   Manual therapy comments  T.P relase and STM/IASTM  to Rt lumbar, glutes/piriformis, LE distraction                  PT Long Term Goals - 01/01/18 1056      PT LONG TERM GOAL #1  Title  Pt will be I and compliant with HEP. 6 weeks 02/12/18    Status  New      PT LONG TERM GOAL #2   Title  Pt will report overall 90% reduction in pain and radiculopathy. 6 weeks 02/12/18    Status  New      PT LONG TERM GOAL #3   Title  Pt will be able to walk at least 30 min without increased pain. 6 weeks 02/12/18    Status  New      PT LONG TERM GOAL #4   Title  Pt will be able to drive at least an hour without increased pain. 6 weeks 02/12/18    Status  New            Plan - 01/04/18 0900    Clinical Impression Statement  Pt treated with lumbar/pelvic and glute/piriformis stretching program and MT to reduce tightness and restrictions followed by heat and TENS to reduce pain and inflammaiton. PT will continue to work towards reducing tighntess and postural correction.    Rehab Potential  Good    PT Frequency  2x / week    PT Duration  6 weeks    PT Treatment/Interventions  ADLs/Self Care Home Management;Cryotherapy;Electrical Stimulation;Iontophoresis 4mg /ml Dexamethasone;Moist Heat;Traction;Ultrasound;Therapeutic exercise;Therapeutic activities;Neuromuscular  re-education;Manual techniques;Passive range of motion;Dry needling;Taping    PT Next Visit Plan  MT, stretching, postural correction, may try lumbar traction       Patient will benefit from skilled therapeutic intervention in order to improve the following deficits and impairments:  Decreased activity tolerance, Decreased range of motion, Decreased strength, Impaired flexibility, Difficulty walking, Obesity, Postural dysfunction, Pain, Increased muscle spasms  Visit Diagnosis: Acute bilateral low back pain with right-sided sciatica     Problem List Patient Active Problem List   Diagnosis Date Noted  . Acute bilateral low back pain with right-sided sciatica 11/17/2017  . B12 deficiency 08/11/2016  . Vitamin D deficiency 08/11/2016  . Numbness and tingling of right leg 05/06/2016  . Cholelithiasis 05/23/2015  . Morbid obesity (HCC) 05/23/2015  . Fatty liver 05/23/2015  . Diabetes mellitus type 2 in obese [facility](HCC) 05/23/2015    April MansonBrian R Shantika Bermea, PT, DPT 01/04/2018, 9:09 AM  [facility] [address] [place], [place], [address] Phone: [phone]   Fax:  [phone]  Name: Bridget Fletcher MRN: [id number] Date of Birth: 08/20/1995

## 2018-01-07 ENCOUNTER — Telehealth: Payer: Self-pay | Admitting: Internal Medicine

## 2018-01-07 ENCOUNTER — Ambulatory Visit: Payer: Managed Care, Other (non HMO) | Admitting: Physical Therapy

## 2018-01-07 DIAGNOSIS — M5441 Lumbago with sciatica, right side: Secondary | ICD-10-CM

## 2018-01-07 NOTE — Therapy (Signed)
[facility] [address] [place], [place], [address] Phone: [phone]   Fax:  [phone]  Physical Therapy Treatment  Patient Details  Name: Bridget Fletcher MRN: [id number] Date of Birth: 1996/01/15 Referring Provider: Hillard Danker MD   Encounter Date: 01/07/2018  PT End of Session - 01/07/18 1743    Visit Number  3    Number of Visits  12    Date for PT Re-Evaluation  02/12/18    PT Start Time  1530    PT Stop Time  1625    PT Time Calculation (min)  55 min    Activity Tolerance  Patient tolerated treatment well    Behavior During Therapy  [facility] for tasks assessed/performed       Past Medical History:  Diagnosis Date  . Diabetes mellitus without complication (HCC)   . E. coli UTI 07/29/15   [facility] ; sensitive to Cipro  . Gall stones   . GERD (gastroesophageal reflux disease)     Past Surgical History:  Procedure Laterality Date  . CHOLECYSTECTOMY N/A 07/06/2015   Procedure: LAPAROSCOPIC CHOLECYSTECTOMY WITH INTRAOPERATIVE CHOLANGIOGRAM;  Surgeon: Avel Peace, MD;  Location: [facility];  Service: General;  Laterality: N/A;  . TONSILLECTOMY AND ADENOIDECTOMY      There were no vitals filed for this visit.  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    Currently in Pain?  Yes    Pain Score  5     Pain Location  Hip    Pain Orientation  Right                       [facility] Adult PT Treatment/Exercise - 01/07/18 1643      Exercises   Exercises  Lumbar      Lumbar Exercises: Stretches   Double Knee to Chest Stretch  3 reps;30 seconds    Hip Flexor Stretch  3 reps;30 seconds   supine with strap   Quad Stretch  Right;3 reps;30 seconds   supine quad/hip flex   ITB Stretch  30 seconds;3 reps     Piriformis Stretch  30 seconds;3 reps      Lumbar Exercises: Aerobic   Stationary Bike  5 min warmup      Lumbar Exercises: Supine   Ab Set  5 reps;5 seconds    Pelvic Tilt  20 reps;5 seconds    Clam  20 reps   green     Modalities   Modalities  Moist Heat;Electrical Stimulation      Moist Heat Therapy   Number Minutes Moist Heat  15 Minutes    Moist Heat Location  Lumbar Spine      Electrical Stimulation   Electrical Stimulation Location  lumbar/Rt glutes    Electrical Stimulation Action  IFC    Electrical Stimulation Parameters  tolerance 15 min    Electrical Stimulation Goals  Pain      Manual Therapy   Manual therapy comments  T.P relase and STM/IASTM  to Rt lumbar, glutes/piriformis, LE distraction                  PT Long Term Goals - 01/01/18 1056      PT LONG TERM GOAL #1  Title  Pt will be I and compliant with HEP. 6 weeks 02/12/18    Status  New      PT LONG TERM GOAL #2   Title  Pt will report overall 90% reduction in pain and radiculopathy. 6 weeks 02/12/18    Status  New      PT LONG TERM GOAL #3   Title  Pt will be able to walk at least 30 min without increased pain. 6 weeks 02/12/18    Status  New      PT LONG TERM GOAL #4   Title  Pt will be able to drive at least an hour without increased pain. 6 weeks 02/12/18    Status  New            Plan - 01/07/18 1744    Clinical Impression Statement  Pt continues to get relief with PT but then may be aggravating her back with her job. 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 PT will examine body mechanics with lifting next time.    Rehab Potential  Good    PT Frequency  2x / week    PT Duration  6 weeks    PT Next Visit Plan  examine body mechanics, traction?       Patient will benefit from skilled therapeutic intervention in order to improve the following deficits and impairments:  Decreased activity  tolerance, Decreased range of motion, Decreased strength, Impaired flexibility, Difficulty walking, Obesity, Postural dysfunction, Pain, Increased muscle spasms  Visit Diagnosis: Acute bilateral low back pain with right-sided sciatica     Problem List Patient Active Problem List   Diagnosis Date Noted  . Acute bilateral low back pain with right-sided sciatica 11/17/2017  . B12 deficiency 08/11/2016  . Vitamin D deficiency 08/11/2016  . Numbness and tingling of right leg 05/06/2016  . Cholelithiasis 05/23/2015  . Morbid obesity (HCC) 05/23/2015  . Fatty liver 05/23/2015  . Diabetes mellitus type 2 in obese [facility](HCC) 05/23/2015    Bridget MansonBrian R Fletcher, PT, DPT 01/07/2018, 5:46 PM  [facility] [address] [place], [place], [address] Phone: [phone]   Fax:  [phone]  Name: Bridget Fletcher MRN: [id number] Date of Birth: 10/25/1995

## 2018-01-07 NOTE — Telephone Encounter (Signed)
Physical rx done for pickup to take to pharmacy.

## 2018-01-07 NOTE — Telephone Encounter (Signed)
Copied from CRM [phone]#[id number]. Topic: Inquiry >> Jan 07, 2018  3:11 PM Angela NevinWilliams, Candice N wrote: Reason for CRM: Pt stated that she is in physical therapy and is currently using TENS 7000. Patients physical therapist recommended pt speak with a nurse at the office to see if Dr Okey Duprerawford could prescribe TENS 7000 for her so that it is covered by her insurance. Pt is requesting a call back.  Please advise.

## 2018-01-07 NOTE — Telephone Encounter (Signed)
LVM informing patient she could pick it up the RX at her convenience it is placed up front

## 2018-01-07 NOTE — Telephone Encounter (Signed)
This a electronic muscle stimulator. Please advise

## 2018-01-12 ENCOUNTER — Ambulatory Visit: Payer: Managed Care, Other (non HMO) | Attending: Internal Medicine | Admitting: Physical Therapy

## 2018-01-12 DIAGNOSIS — M5441 Lumbago with sciatica, right side: Secondary | ICD-10-CM | POA: Insufficient documentation

## 2018-01-12 NOTE — Therapy (Addendum)
[place], [place], [address] Phone: [phone]   Fax:  [phone]  Physical Therapy Treatment/Discharge  Patient Details  Name: Bridget Fletcher MRN: [id number] Date of Birth: [birth year] Referring Provider: Pricilla Holm MD   Encounter Date: 01/12/2018  PT End of Session - 01/12/18 1229    Visit Number  4    Number of Visits  12    Date for PT Re-Evaluation  02/12/18    PT Start Time  7530    PT Stop Time  1040    PT Time Calculation (min)  53 min    Activity Tolerance  Patient tolerated treatment well    Behavior During Therapy  [facility] for tasks assessed/performed       Past Medical History:  Diagnosis Date  . Diabetes mellitus without complication ([place])   . E. coli UTI 07/29/15   [facility] ; sensitive to Cipro  . Gall stones   . GERD (gastroesophageal reflux disease)     Past Surgical History:  Procedure Laterality Date  . CHOLECYSTECTOMY N/A 07/06/2015   Procedure: LAPAROSCOPIC CHOLECYSTECTOMY WITH INTRAOPERATIVE CHOLANGIOGRAM;  Surgeon: Jackolyn Confer, MD;  [facility];  Service: General;  Laterality: N/A;  . TONSILLECTOMY AND ADENOIDECTOMY      There were no vitals filed for this visit.  Subjective Assessment - 01/12/18 1202    Subjective  Pt relays continued back and Rt leg pain. She relays compliance with HEP and has been using lumbar support now when driving    Currently in Pain?  Yes    Pain Score  7     Pain Location  Hip    Pain Orientation  Right    Pain Descriptors / Indicators  Pins and needles    Pain Type  Chronic pain    Pain Onset  More than a month ago    Pain Frequency  Intermittent                       [facility] Adult PT Treatment/Exercise - 01/12/18 0001      Ambulation/Gait   Ambulation Distance (Feet)  220 Feet    Gait Comments  WFL, no pain with ambulation      Lumbar Exercises: Aerobic   Stationary Bike  5 min warmup      Modalities   Modalities  Moist Heat;Electrical Stimulation;Traction      Moist Heat Therapy   Number Minutes Moist Heat  15 Minutes    Moist Heat Location  Lumbar Spine      Electrical Stimulation   Electrical Stimulation Location  lumbar/Rt glutes    Electrical Stimulation Action  IFC    Electrical Stimulation Parameters  toleance    Electrical Stimulation Goals  Pain      Traction   Type of Traction  Lumbar    Min (lbs)  25    Max (lbs)  50    Time  17             PT Education - 01/12/18 1229    Education Details  traction    Person(s) Educated  Patient    Methods  Explanation    Comprehension  Verbalized understanding          PT Long Term Goals - 01/01/18 1056      PT LONG TERM GOAL #1   Title  Pt will be I and compliant with HEP. 6 weeks 02/12/18    Status  New      PT LONG TERM GOAL #2   Title  Pt will report overall 90% reduction in pain and radiculopathy. 6 weeks 02/12/18    Status  New      PT LONG TERM GOAL #3   Title  Pt will be able to walk at least 30 min without increased pain. 6 weeks 02/12/18    Status  New      PT LONG TERM GOAL #4   Title  Pt will be able to drive at least an hour without increased pain. 6 weeks 02/12/18    Status  New            Plan - 01/12/18 1230    Clinical Impression Statement  Pt continues to have Rt leg radiculopathy and it is unclear if this is coming from lumbar sciatica or from Meralgia paraesthetica. Mechanical traction was trialed today to reduce spinal compression and nerve compression. Pt did not have pain with walking immediately after this.     Rehab Potential  Good    PT Frequency  2x / week    PT Duration  6 weeks    PT Treatment/Interventions  ADLs/Self Care Home Management;Cryotherapy;Electrical Stimulation;Iontophoresis 60m/ml Dexamethasone;Moist Heat;Traction;Ultrasound;Therapeutic exercise;Therapeutic activities;Neuromuscular re-education;Manual techniques;Passive range of motion;Dry  needling;Taping    PT Next Visit Plan  assess response to traction       Patient will benefit from skilled therapeutic intervention in order to improve the following deficits and impairments:  Decreased activity tolerance, Decreased range of motion, Decreased strength, Impaired flexibility, Difficulty walking, Obesity, Postural dysfunction, Pain, Increased muscle spasms  Visit Diagnosis: Acute bilateral low back pain with right-sided sciatica     Problem List Patient Active Problem List   Diagnosis Date Noted  . Acute bilateral low back pain with right-sided sciatica 11/17/2017  . B12 deficiency 08/11/2016  . Vitamin D deficiency 08/11/2016  . Numbness and tingling of right leg 05/06/2016  . Cholelithiasis 05/23/2015  . Morbid [place] ([facility] [place] 05/23/2015  . Fatty liver 05/23/2015  . Diabetes mellitus type 2 in obese ([facility] 05/23/2015    BDebbe Odea PT, DPT 01/12/2018, 12:37 PM  [facility] [place] [address]Phone: [phone]  Fax:  [phone] Name: Bridget ChavousMRN: [id number] of Birth: [birth year]  PHYSICAL THERAPY DISCHARGE SUMMARY  Visits from Start of Care: 4  Current functional level related to goals / functional outcomes: See above   Remaining deficits: See above  Plan: Patient agrees to discharge.  Patient goals were not met. Patient is being discharged due to not returning since the last visit.  ?????    BElsie Ra PT, DPT 02/17/18 4:11 PM

## 2018-01-14 ENCOUNTER — Ambulatory Visit: Payer: Managed Care, Other (non HMO) | Admitting: Physical Therapy

## 2018-01-19 ENCOUNTER — Ambulatory Visit: Payer: Managed Care, Other (non HMO) | Admitting: Physical Therapy

## 2018-01-21 ENCOUNTER — Ambulatory Visit: Payer: Managed Care, Other (non HMO) | Admitting: Physical Therapy

## 2018-01-26 ENCOUNTER — Ambulatory Visit: Payer: Managed Care, Other (non HMO) | Admitting: Physical Therapy

## 2018-01-28 ENCOUNTER — Encounter: Payer: Managed Care, Other (non HMO) | Admitting: Physical Therapy

## 2018-02-01 ENCOUNTER — Telehealth: Payer: Self-pay

## 2018-02-01 DIAGNOSIS — M5441 Lumbago with sciatica, right side: Secondary | ICD-10-CM

## 2018-02-01 NOTE — Telephone Encounter (Signed)
Copied from CRM [id number]#[id number]. Topic: Referral - Request >> Feb 01, 2018 12:35 PM Trula SladeWalter, Linda F wrote: Reason for CRM:  Patient would like to know if the provider can get her in to see someone that can do a nerve conduction study (a nerve root block if necessary).

## 2018-02-01 NOTE — Addendum Note (Signed)
Addended by: Hillard DankerRAWFORD, Fidelia Cathers A on: 02/01/2018 04:28 PM   Modules accepted: Orders

## 2018-02-01 NOTE — Telephone Encounter (Signed)
Patient informed that the referral was placed states that she has already had an MRI there were no herniated disks or pinched nerves.

## 2018-02-01 NOTE — Telephone Encounter (Signed)
We have put in referral for neurology who can do the nerve conduction. Would she like to do an MRI in the meantime to look at the back and the nerves to see if any are pinched? We have put this order in as well.

## 2018-02-02 NOTE — Telephone Encounter (Signed)
Noted. Already had a conversation with patient about it

## 2018-02-02 NOTE — Telephone Encounter (Signed)
Patient is calling and states she does not want to get a MRI done until after the nerve study.

## 2018-02-03 LAB — HM DIABETES EYE EXAM

## 2018-02-05 ENCOUNTER — Telehealth: Payer: Self-pay | Admitting: Internal Medicine

## 2018-02-05 NOTE — Telephone Encounter (Signed)
Also I have the form, & informed the patient I will hold the form until her appointment.

## 2018-02-05 NOTE — Telephone Encounter (Signed)
Patient has called and states that Oct 3 will not work for her, she needs this ASAP.   Do you think another provider is willing to sign off on this?

## 2018-02-05 NOTE — Telephone Encounter (Signed)
Patient dropped off a Health Assessment/Medical Report form to be completed for a job.   Patient has not had a CPE in 2 years or so. Her LOV was in July for back pain. She would need an appointment for this form. I have set up an appointment for 10/3.   Patient has been informed that her provider is out of office until Oct 1.

## 2018-02-08 NOTE — Telephone Encounter (Signed)
Patient informed that she has to be seen by Dr. Okey Duprerawford or can see her OBGYN. Patient stated that she was told she can go to the urgent care and get it filled put patient will be by to pick up the form

## 2018-02-11 ENCOUNTER — Encounter: Payer: Self-pay | Admitting: Internal Medicine

## 2018-02-11 NOTE — Progress Notes (Signed)
Abstracted and sent to scan  

## 2018-02-15 ENCOUNTER — Ambulatory Visit (INDEPENDENT_AMBULATORY_CARE_PROVIDER_SITE_OTHER): Payer: Managed Care, Other (non HMO) | Admitting: Family

## 2018-02-15 ENCOUNTER — Encounter: Payer: Self-pay | Admitting: Family

## 2018-02-15 VITALS — BP 128/84 | HR 70 | Temp 98.4°F | Resp 18 | Ht 64.0 in | Wt 268.6 lb

## 2018-02-15 DIAGNOSIS — K219 Gastro-esophageal reflux disease without esophagitis: Secondary | ICD-10-CM | POA: Diagnosis not present

## 2018-02-15 MED ORDER — PANTOPRAZOLE SODIUM 40 MG PO TBEC: 40.0000 mg | DELAYED_RELEASE_TABLET | Freq: Every day | ORAL | 0 refills | Status: AC

## 2018-02-15 NOTE — Progress Notes (Signed)
Bridget Fletcher is a 22 y.o. female with the following history as recorded in EpicCare:  Patient Active Problem List   Diagnosis Date Noted  . Acute bilateral low back pain with right-sided sciatica 11/17/2017  . B12 deficiency 08/11/2016  . Vitamin D deficiency 08/11/2016  . Numbness and tingling of right leg 05/06/2016  . Cholelithiasis 05/23/2015  . Morbid obesity (HCC) 05/23/2015  . Fatty liver 05/23/2015  . Diabetes mellitus type 2 in obese (HCC) 05/23/2015    Current Outpatient Medications  Medication Sig Dispense Refill  . etonogestrel-ethinyl estradiol (NUVARING) 0.12-0.015 MG/24HR vaginal ring Place 1 each vaginally every 28 (twenty-eight) days. Insert vaginally and leave in place for 3 consecutive weeks, then remove for 1 week.    . Insulin Pen Needle (NOVOFINE) 32G X 6 MM MISC Use with Saxenda daily 100 each 1  . Liraglutide -Weight Management (SAXENDA) 18 MG/3ML SOPN Inject 3 mg into the skin daily. 5 pen 11  . ondansetron (ZOFRAN) 4 MG tablet TAKE 1 TABLET BY MOUTH EVERY 8 HOURS AS NEEDED FOR NAUSEA OR VOMITING  0  . pantoprazole (PROTONIX) 40 MG tablet Take 1 tablet (40 mg total) by mouth daily. 30 tablet 0   No current facility-administered medications for this visit.     Allergies: Patient has no known allergies.  Past Medical History:  Diagnosis Date  . Diabetes mellitus without complication (HCC)   . E. coli UTI 07/29/15   [facility] ; sensitive to Cipro  . Gall stones   . GERD (gastroesophageal reflux disease)     Past Surgical History:  Procedure Laterality Date  . CHOLECYSTECTOMY N/A 07/06/2015   Procedure: LAPAROSCOPIC CHOLECYSTECTOMY WITH INTRAOPERATIVE CHOLANGIOGRAM;  Surgeon: Avel Peace, MD;  Location: [facility];  Service: General;  Laterality: N/A;  . TONSILLECTOMY AND ADENOIDECTOMY      Family History  Problem Relation Age of Onset  . Hypertension Mother   . Hypertension Maternal Grandmother     Social History   Tobacco Use  . Smoking  status: Never Smoker  . Smokeless tobacco: Never Used  Substance Use Topics  . Alcohol use: Yes    Alcohol/week: 0.0 standard drinks    Comment: rare    Subjective:  Patient presents with concerns for "sour stomach"- symptoms started at the end of August; denies any history of GERD; is concerned that she might have H. Pylori; would like to get testing done as soon as possible; + nausea; no vomiting; no blood in stool; is on Saxenda- notes she tolerated well from March to July; re-started in late July/ early August- first episode of "sour stomach" occurred at the end of August;   Scheduled to see her PCP later this week for CPE:    Objective:  Vitals:   02/15/18 1430  BP: 128/84  Pulse: 70  Resp: 18  Temp: 98.4 F (36.9 C)  TempSrc: Oral  Weight: 268 lb 9.6 oz (121.8 kg)  Height: 5\' 4"  (1.626 m)    General: Well developed, well nourished, in no acute distress  Skin : Warm and dry.  Head: Normocephalic and atraumatic  Neck: Supple without thyromegaly, adenopathy  Lungs: Respirations unlabored; clear to auscultation bilaterally without wheeze, rales, rhonchi  CVS exam: normal rate and regular rhythm.  Neurologic: Alert and oriented; speech intact; face symmetrical; moves all extremities well; CNII-XII intact without focal deficit  Assessment:  1. Gastroesophageal reflux disease, esophagitis presence not specified     Plan:  1. Will start patient on Protonix 40 mg  qd; she understands to hold this medication for 2 weeks prior to GI appointment so as not to interfere with desired H. Pylori testing; ? If symptoms could be related to [place] but she notes she took the medication for 4 months earlier this year with no difficulty; labs deferred today as she is due to see her PCP later this week for CPE.   No follow-ups on file.  Orders Placed This Encounter  Procedures  . Ambulatory referral to Gastroenterology    Referral Priority:   Routine    Referral Type:   Consultation     Referral Reason:   Specialty Services Required    Number of Visits Requested:   1    Requested Prescriptions   Signed Prescriptions Disp Refills  . pantoprazole (PROTONIX) 40 MG tablet 30 tablet 0    Sig: Take 1 tablet (40 mg total) by mouth daily.

## 2018-02-15 NOTE — Patient Instructions (Signed)
Food Choices for Gastroesophageal Reflux Disease, Adult When you have gastroesophageal reflux disease (GERD), the foods you eat and your eating habits are very important. Choosing the right foods can help ease your discomfort. What guidelines do I need to follow?  Choose fruits, vegetables, whole grains, and low-fat dairy products.  Choose low-fat meat, fish, and poultry.  Limit fats such as oils, salad dressings, butter, nuts, and avocado.  Keep a food diary. This helps you identify foods that cause symptoms.  Avoid foods that cause symptoms. These may be different for everyone.  Eat small meals often instead of 3 large meals a day.  Eat your meals slowly, in a place where you are relaxed.  Limit fried foods.  Cook foods using methods other than frying.  Avoid drinking alcohol.  Avoid drinking large amounts of liquids with your meals.  Avoid bending over or lying down until 2-3 hours after eating. What foods are not recommended? These are some foods and drinks that may make your symptoms worse: Vegetables  Tomatoes. Tomato juice. Tomato and spaghetti sauce. Chili peppers. Onion and garlic. Horseradish. Fruits  Oranges, grapefruit, and lemon (fruit and juice). Meats  High-fat meats, fish, and poultry. This includes hot dogs, ribs, ham, sausage, salami, and bacon. Dairy  Whole milk and chocolate milk. Sour cream. Cream. Butter. Ice cream. Cream cheese. Drinks  Coffee and tea. Bubbly (carbonated) drinks or energy drinks. Condiments  Hot sauce. Barbecue sauce. Sweets/Desserts  Chocolate and cocoa. Donuts. Peppermint and spearmint. Fats and Oils  High-fat foods. This includes French fries and potato chips. Other  Vinegar. Strong spices. This includes black pepper, white pepper, red pepper, cayenne, curry powder, cloves, ginger, and chili powder. The items listed above may not be a complete list of foods and drinks to avoid. Contact your dietitian for more information.    This information is not intended to replace advice given to you by your health care provider. Make sure you discuss any questions you have with your health care provider. Document Released: 11/04/2011 Document Revised: 10/11/2015 Document Reviewed: 03/09/2013 Elsevier Interactive Patient Education  2017 Elsevier Inc.  

## 2018-02-18 ENCOUNTER — Encounter: Payer: Self-pay | Admitting: Internal Medicine

## 2018-02-18 ENCOUNTER — Encounter

## 2018-02-18 ENCOUNTER — Ambulatory Visit (INDEPENDENT_AMBULATORY_CARE_PROVIDER_SITE_OTHER): Payer: Managed Care, Other (non HMO) | Admitting: Gastroenterology

## 2018-02-18 ENCOUNTER — Ambulatory Visit (INDEPENDENT_AMBULATORY_CARE_PROVIDER_SITE_OTHER): Payer: Managed Care, Other (non HMO) | Admitting: Internal Medicine

## 2018-02-18 ENCOUNTER — Encounter: Payer: Self-pay | Admitting: Gastroenterology

## 2018-02-18 ENCOUNTER — Other Ambulatory Visit (INDEPENDENT_AMBULATORY_CARE_PROVIDER_SITE_OTHER): Payer: Managed Care, Other (non HMO)

## 2018-02-18 VITALS — BP 124/78 | HR 84 | Ht 64.0 in | Wt 269.1 lb

## 2018-02-18 VITALS — BP 122/60 | HR 88 | Temp 99.0°F | Ht 64.0 in | Wt 268.0 lb

## 2018-02-18 DIAGNOSIS — Z0000 Encounter for general adult medical examination without abnormal findings: Secondary | ICD-10-CM

## 2018-02-18 DIAGNOSIS — M5441 Lumbago with sciatica, right side: Secondary | ICD-10-CM | POA: Diagnosis not present

## 2018-02-18 DIAGNOSIS — E538 Deficiency of other specified B group vitamins: Secondary | ICD-10-CM

## 2018-02-18 DIAGNOSIS — E669 Obesity, unspecified: Secondary | ICD-10-CM

## 2018-02-18 DIAGNOSIS — E1169 Type 2 diabetes mellitus with other specified complication: Secondary | ICD-10-CM | POA: Diagnosis not present

## 2018-02-18 DIAGNOSIS — R1110 Vomiting, unspecified: Secondary | ICD-10-CM

## 2018-02-18 DIAGNOSIS — K760 Fatty (change of) liver, not elsewhere classified: Secondary | ICD-10-CM | POA: Diagnosis not present

## 2018-02-18 DIAGNOSIS — E559 Vitamin D deficiency, unspecified: Secondary | ICD-10-CM

## 2018-02-18 DIAGNOSIS — R110 Nausea: Secondary | ICD-10-CM

## 2018-02-18 LAB — CBC
HEMATOCRIT: 38.4 % (ref 36.0–46.0)
Hemoglobin: 12.6 g/dL (ref 12.0–15.0)
MCHC: 32.8 g/dL (ref 30.0–36.0)
MCV: 79.1 fl (ref 78.0–100.0)
PLATELETS: 404 10*3/uL — AB (ref 150.0–400.0)
RBC: 4.85 Mil/uL (ref 3.87–5.11)
RDW: 13.8 % (ref 11.5–15.5)
WBC: 6.8 10*3/uL (ref 4.0–10.5)

## 2018-02-18 LAB — LIPID PANEL
CHOL/HDL RATIO: 3
Cholesterol: 160 mg/dL (ref 0–200)
HDL: 53.9 mg/dL (ref >39.00)
LDL CALC: 83 mg/dL (ref 0–99)
NonHDL: 106.09
Triglycerides: 113 mg/dL (ref 0.0–149.0)
VLDL: 22.6 mg/dL (ref 0.0–40.0)

## 2018-02-18 LAB — COMPREHENSIVE METABOLIC PANEL
ALT: 28 U/L (ref 0–35)
AST: 19 U/L (ref 0–37)
Albumin: 3.5 g/dL (ref 3.5–5.2)
Alkaline Phosphatase: 57 U/L (ref 39–117)
BUN: 5 mg/dL — ABNORMAL LOW (ref 6–23)
CALCIUM: 9 mg/dL (ref 8.4–10.5)
CHLORIDE: 105 meq/L (ref 96–112)
CO2: 25 meq/L (ref 19–32)
Creatinine, Ser: 0.75 mg/dL (ref 0.40–1.20)
GFR: 121.85 mL/min (ref >60.00)
Glucose, Bld: 113 mg/dL — ABNORMAL HIGH (ref 70–99)
POTASSIUM: 3.4 meq/L — AB (ref 3.5–5.1)
Sodium: 137 mEq/L (ref 135–145)
Total Bilirubin: 0.4 mg/dL (ref 0.2–1.2)
Total Protein: 7.4 g/dL (ref 6.0–8.3)

## 2018-02-18 LAB — VITAMIN B12: VITAMIN B 12: 163 pg/mL — AB (ref 211–911)

## 2018-02-18 LAB — HEMOGLOBIN A1C: HEMOGLOBIN A1C: 6 % (ref 4.6–6.5)

## 2018-02-18 LAB — TSH: TSH: 1.85 u[IU]/mL (ref 0.35–4.50)

## 2018-02-18 NOTE — Assessment & Plan Note (Signed)
1 reading HgA1c 6.5 and then in pre-diabetes range. Would not consider true diabetic at this time. Taking saxenda for weight loss currently. Foot exam done.

## 2018-02-18 NOTE — Progress Notes (Signed)
   Subjective:    Patient ID: Bridget Fletcher, female    DOB: 02-26-1996, 22 y.o.   MRN: [id number]  HPI The patient is a 22 YO female coming in for physical.   PMH, [facility], social history reviewed and updated.   Review of Systems  Constitutional: Negative.   HENT: Negative.   Eyes: Negative.   Respiratory: Negative for cough, chest tightness and shortness of breath.   Cardiovascular: Negative for chest pain, palpitations and leg swelling.  Gastrointestinal: Negative for abdominal distention, abdominal pain, constipation, diarrhea, nausea and vomiting.       GERD  Musculoskeletal: Positive for back pain.  Skin: Negative.   Neurological: Negative.   Psychiatric/Behavioral: Negative.       Objective:   Physical Exam  Constitutional: She is oriented to person, place, and time. She appears well-developed and well-nourished.  Overweight  HENT:  Head: Normocephalic and atraumatic.  Eyes: EOM are normal.  Neck: Normal range of motion.  Cardiovascular: Normal rate and regular rhythm.  Pulmonary/Chest: Effort normal and breath sounds normal. No respiratory distress. She has no wheezes. She has no rales.  Abdominal: Soft. Bowel sounds are normal. She exhibits no distension. There is no tenderness. There is no rebound.  Musculoskeletal: She exhibits no edema.  Neurological: She is alert and oriented to person, place, and time. Coordination normal.  Skin: Skin is warm and dry.  Psychiatric: She has a normal mood and affect.   Vitals:   02/18/18 0809  BP: 122/60  Pulse: 88  Temp: 99 F (37.2 C)  TempSrc: Oral  Weight: 268 lb (121.6 kg)  Height: 5\' 4"  (1.626 m)      Assessment & Plan:

## 2018-02-18 NOTE — Patient Instructions (Signed)
If you are age 23 or older, your body mass index should be between 23-30. Your Body mass index is 46.2 kg/m. If this is out of the aforementioned range listed, please consider follow up with your Primary Care Provider.  If you are age 75 or younger, your body mass index should be between 19-25. Your Body mass index is 46.2 kg/m. If this is out of the aformentioned range listed, please consider follow up with your Primary Care Provider.   You have been scheduled for an endoscopy. Please follow written instructions given to you at your visit today. If you use inhalers (even only as needed), please bring them with you on the day of your procedure. Your physician has requested that you go to www.startemmi.com and enter the access code given to you at your visit today. This web site gives a general overview about your procedure. However, you should still follow specific instructions given to you by our office regarding your preparation for the procedure.  It was a pleasure to see you today!  Vito Cirigliano, D.O.

## 2018-02-18 NOTE — Patient Instructions (Signed)

## 2018-02-18 NOTE — Assessment & Plan Note (Signed)
Working on weight loss and down about 30 pounds in the last year. She is still working on more weight loss. Has associated diabetes, fatty liver, GERD.

## 2018-02-18 NOTE — Progress Notes (Signed)
Chief Complaint: Nausea and "sour stomach"   Referring Provider:     Olive Bass, FNP    HPI:     Bridget Fletcher is a 22 y.o. female referred to the Gastroenterology Clinic for evaluation of   Acute onset sxs Aug 25th. Increased belching, nausea, regurgitation, and sour taste in mouth. No previous similar sxs. Lasted 1 day, recurred 2 days later and a total of 5-6 times since then. Self induced emesis for relief of sxs. Improved with Pepto and Gas-X. Last episode was 3 days ago. Can occur during day or early AM. Independent of PO intake or types of food. No nocturnal sxs. Intermittent metallic taste over last few months. No lower GI sxs. Has not trialed any acid suppression medications. Saw Ria Clock, FNP in [facility] Medicine [facility] earlier this week and prescribed Protonix, but has not picked it up.   Recent evaluation notable for low vitamin B12 (163), normal TSH, Low potassium (3.4) otherwise normal CMP, normal CBC.  Review of prior labs notable for vitamin D deficiency and 07/2016 (15.2) which was actually 9.5 in 04/2016, treated with vitamin D 5000 units daily.    RUQ ultrasound in 04/2015 with cholelithiasis with otherwise normal GB; liver with coarsening of hepatic echotexture without focal lesion suggesting cirrhosis versus fatty infiltration.  Otherwise no recent abdominal imaging for review. CCY in 2017.   Past Medical History:  Diagnosis Date  . Diabetes mellitus without complication (HCC)   . E. coli UTI 07/29/15   [facility] ; sensitive to Cipro  . Gall stones   . GERD (gastroesophageal reflux disease)      Past Surgical History:  Procedure Laterality Date  . CHOLECYSTECTOMY N/A 07/06/2015   Procedure: LAPAROSCOPIC CHOLECYSTECTOMY WITH INTRAOPERATIVE CHOLANGIOGRAM;  Surgeon: Avel Peace, MD;  Location: [facility];  Service: General;  Laterality: N/A;  . TONSILLECTOMY AND ADENOIDECTOMY     Family History  Problem Relation Age of Onset   . Hypertension Mother   . Diabetes Mother   . Hypertension Maternal Grandmother   . Liver cancer Other    Social History   Tobacco Use  . Smoking status: Never Smoker  . Smokeless tobacco: Never Used  Substance Use Topics  . Alcohol use: Yes    Alcohol/week: 0.0 standard drinks    Comment: rare  . Drug use: No   Current Outpatient Medications  Medication Sig Dispense Refill  . etonogestrel-ethinyl estradiol (NUVARING) 0.12-0.015 MG/24HR vaginal ring Place 1 each vaginally every 28 (twenty-eight) days. Insert vaginally and leave in place for 3 consecutive weeks, then remove for 1 week.    . Insulin Pen Needle (NOVOFINE) 32G X 6 MM MISC Use with Saxenda daily 100 each 1  . Liraglutide -Weight Management (SAXENDA) 18 MG/3ML SOPN Inject 3 mg into the skin daily. 5 pen 11  . ondansetron (ZOFRAN) 4 MG tablet TAKE 1 TABLET BY MOUTH EVERY 8 HOURS AS NEEDED FOR NAUSEA OR VOMITING  0  . pantoprazole (PROTONIX) 40 MG tablet Take 1 tablet (40 mg total) by mouth daily. (Patient not taking: Reported on 02/18/2018) 30 tablet 0   No current facility-administered medications for this visit.    No Known Allergies   Review of Systems: All systems reviewed and negative except where noted in HPI.     Physical Exam:    Wt Readings from Last 3 Encounters:  02/18/18 269 lb 2 oz (122.1 kg)  02/18/18 268 lb (  121.6 kg)  02/15/18 268 lb 9.6 oz (121.8 kg)    BP 124/78   Pulse 84   Ht 5\' 4"  (1.626 m)   Wt 269 lb 2 oz (122.1 kg)   BMI 46.20 kg/m  Constitutional:  Pleasant, in no acute distress. Psychiatric: Normal mood and affect. Behavior is normal. EENT: Pupils normal.  Conjunctivae are normal. No scleral icterus. Neck supple. No cervical LAD. Cardiovascular: Normal rate, regular rhythm. No edema Pulmonary/chest: Effort normal and breath sounds normal. No wheezing, rales or rhonchi. Abdominal: Soft, nondistended, nontender. Bowel sounds active throughout. There are no masses palpable. No  hepatomegaly. Neurological: Alert and oriented to person place and time. Skin: Skin is warm and dry. No rashes noted.   ASSESSMENT AND PLAN;   Bridget Fletcher is a 22 y.o. female presenting with:  1) Regurgitation, Sour taste in mouth:  Clinical presentation seems most consistent with intermittent reflux.  Discussed DDX at length to include diagnostic and treatment options.  Discussed trial of high-dose acid suppression therapy for diagnostic and therapeutic intent plus minus H. pylori testing versus EGD to evaluate for objective evidence of reflux, LES laxity, hiatal hernia, gastritis.  The patient is quite concerned with her symptoms and concerned about LES compromise as etiology, I would like to proceed with endoscopy at this time. - EGD with random and directed biopsies - Recommended to start Protonix as previously prescribed - Employed antireflux lifestyle measures  2) Nausea: Will evaluate for UGI etiology, gastritis, PUD, etc. in the meantime, recommend a course of Protonix as prescribed and evaluate for clinical improvement.  Otherwise, is tolerating p.o. intake in between these episodes.  3) Fatty liver infiltration: Ultrasound in 04/2015 notable for coarsened echotexture.  Otherwise no clinical serologic evidence of impaired hepatic synthetic function.  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  She has intentionally lost 30 pounds this year with improvement in hemoglobin A1c.  At follow-up, can consider repeat imaging as indicated, although with recent normal liver enzymes, may be reasonable to hold off on this.  4) vitamin D deficiency: Will ultimately defer to [facility] for ongoing treatment, but may need to consider a course of ergocalciferol if not improved with current medications, particularly if needing long-term acid suppression therapy.  The indications, risks, and benefits of EGD were explained to the patient in detail. Risks include but are not  limited to bleeding, perforation, adverse reaction to medications, and cardiopulmonary compromise. Sequelae include but are not limited to the possibility of surgery, hositalization, and mortality. The patient verbalized understanding and wished to proceed. All questions answered, referred to scheduler. Further recommendations pending results of the exam.     Shellia Cleverly, DO, FACG  02/18/2018, 2:25 PM   Olive Bass,*

## 2018-02-18 NOTE — Assessment & Plan Note (Signed)
Seeing neurology soon, some relief with chiropractor but therapy did not help symptoms.

## 2018-02-18 NOTE — Assessment & Plan Note (Signed)
Pap at gyn and up to date. Tetanus up to date. Declines flu shot today. Declines HIV screening need. Counseled about sun safety and dangers of distracted driving. Given screening recommendations.

## 2018-02-18 NOTE — Assessment & Plan Note (Signed)
Checking B12 level.  °

## 2018-02-19 ENCOUNTER — Encounter: Payer: Self-pay | Admitting: Gastroenterology

## 2018-02-19 ENCOUNTER — Telehealth: Payer: Self-pay | Admitting: Gastroenterology

## 2018-02-19 NOTE — Telephone Encounter (Signed)
[employer] back to let her patient pt is good to go for the procedure even with the medication she is on. Jonny Ruiz states for any further questions to message him or call if urgent.

## 2018-02-22 NOTE — Telephone Encounter (Signed)
Notified the patient that she does not need to hold her Saxenda prior to procedure.

## 2018-03-01 ENCOUNTER — Telehealth: Payer: Self-pay | Admitting: Internal Medicine

## 2018-03-01 NOTE — Telephone Encounter (Signed)
Copied from CRM [id number]. Topic: Quick Communication - See Telephone Encounter >> Mar 01, 2018  4:05 PM Lorrine Kin, [place] wrote: CRM for notification. See Telephone encounter for: 03/01/18. Patient calling and states that the Saxenda was not approved. Would like prior authorization re done. [id number] - [place], [place] - [address]  Patient also calling about Vit B12 injections. States that she is needing a call back regarding this. Is on lunch at this time and can take a call. Please advise. CB#: [phone]

## 2018-03-02 ENCOUNTER — Encounter: Payer: Self-pay | Admitting: Neurology

## 2018-03-02 ENCOUNTER — Other Ambulatory Visit: Payer: Self-pay | Admitting: Internal Medicine

## 2018-03-02 ENCOUNTER — Ambulatory Visit (INDEPENDENT_AMBULATORY_CARE_PROVIDER_SITE_OTHER): Payer: Managed Care, Other (non HMO) | Admitting: Neurology

## 2018-03-02 DIAGNOSIS — M5431 Sciatica, right side: Secondary | ICD-10-CM | POA: Diagnosis not present

## 2018-03-02 MED ORDER — "SYRINGE 20G X 1"" 3 ML MISC": 0 refills | Status: DC

## 2018-03-02 MED ORDER — LIRAGLUTIDE -WEIGHT MANAGEMENT 18 MG/3ML ~~LOC~~ SOPN: 3.0000 mg | PEN_INJECTOR | Freq: Every day | SUBCUTANEOUS | 11 refills | Status: AC

## 2018-03-02 MED ORDER — CYANOCOBALAMIN 1000 MCG/ML IJ SOLN: 1000.0000 ug | INTRAMUSCULAR | 1 refills | Status: DC

## 2018-03-02 NOTE — Telephone Encounter (Signed)
Okay to resend. 

## 2018-03-02 NOTE — Progress Notes (Signed)
Please refer to EMG and nerve conduction procedure note.  

## 2018-03-02 NOTE — Procedures (Signed)
     HISTORY:  Bridget Fletcher is a 22 year old patient with a history of morbid obesity, diabetes, and a previous right meralgia paresthetica.  The patient has a history of right-sided back and leg discomfort going down to the foot that began in 2015.  The patient is being evaluated for a possible radiculopathy.  NERVE CONDUCTION STUDIES:  Nerve conduction studies were performed on the right lower extremity. The distal motor latencies and motor amplitudes for the peroneal and posterior tibial nerves were within normal limits. The nerve conduction velocities for these nerves were also normal. The sensory latencies for the peroneal and sural nerves were within normal limits. The F wave latency for the posterior tibial nerve was within normal limits.   EMG STUDIES:  EMG study was performed on the right lower extremity:  The tibialis anterior muscle reveals 2 to 4K motor units with full recruitment. No fibrillations or positive waves were seen. The peroneus tertius muscle reveals 2 to 4K motor units with full recruitment. No fibrillations or positive waves were seen. The medial gastrocnemius muscle reveals 1 to 3K motor units with full recruitment. No fibrillations or positive waves were seen. The vastus lateralis muscle reveals 2 to 4K motor units with full recruitment. No fibrillations or positive waves were seen. The iliopsoas muscle reveals 2 to 4K motor units with full recruitment. No fibrillations or positive waves were seen. The biceps femoris muscle (long head) reveals 2 to 4K motor units with full recruitment. No fibrillations or positive waves were seen. The lumbosacral paraspinal muscles were tested at 3 levels, and revealed no abnormalities of insertional activity at all 3 levels tested. There was good relaxation.   IMPRESSION:  Nerve conduction studies done on the right lower extremity were unremarkable, no evidence of a neuropathy is seen.  EMG evaluation of the right lower  extremity is unremarkable, no evidence of an overlying lumbosacral radiculopathy was seen.  Marlan Palau MD 03/02/2018 1:44 PM  [facility] [address] [place], [place] [address]  Phone [phone] Fax [phone]

## 2018-03-02 NOTE — Telephone Encounter (Signed)
Rx sent 

## 2018-03-02 NOTE — Telephone Encounter (Signed)
Patient informed that her PA has been done PA started on CoverMyMeds KEY:   [id number]

## 2018-03-02 NOTE — Telephone Encounter (Signed)
Patient states needs saxenda re-sent to [facility] no longer uses [facility]

## 2018-03-03 ENCOUNTER — Telehealth: Payer: Self-pay | Admitting: Internal Medicine

## 2018-03-03 NOTE — Progress Notes (Signed)
[facility]    Nerve / Sites Muscle Latency Ref. Amplitude Ref. Rel Amp Segments Distance Velocity Ref. Area    ms ms mV mV %  cm m/s m/s mVms  R Peroneal - EDB     Ankle EDB 4.6 ?6.5 5.6 ?2.0 100 Ankle - EDB 9   17.0     Fib head EDB 10.3  4.8  85.9 Fib head - Ankle 30 53 ?44 15.9     Pop fossa EDB 12.1  4.3  89.2 Pop fossa - Fib head 10 55 ?44 15.5         Pop fossa - Ankle      R Tibial - AH     Ankle AH 3.9 ?5.8 17.8 ?4.0 100 Ankle - AH 9   39.9     Pop fossa AH 11.8  15.5  87.2 Pop fossa - Ankle 37 47 ?41 38.0         SNC    Nerve / Sites Rec. Site Peak Lat Ref.  Amp Ref. Segments Distance    ms ms V V  cm  R Sural - Ankle (Calf)     Calf Ankle 3.0 ?4.4 18 ?6 Calf - Ankle 14  R Superficial peroneal - Ankle     Lat leg Ankle 3.4 ?4.4 11 ?6 Lat leg - Ankle 14         F  Wave    Nerve F Lat Ref.   ms ms  R Tibial - AH 46.9 ?56.0

## 2018-03-03 NOTE — Telephone Encounter (Signed)
Copied from CRM [id number]. Topic: General - Other >> Mar 03, 2018  2:55 PM Tamela Oddi wrote: Reason for CRM: Patient called to inform nurse or Dr. Okey Dupre to give her a call back between the hours of 3pm and 4pm because this is the time that she will be available.  Patient wants to talk with the nurse regarding the Gastro sleeve and would really like to talk to someone today.  Please advise.  CB# [phone].

## 2018-03-04 NOTE — Telephone Encounter (Signed)
Patient got nerve condition study on Tuesday wanted to make sure we were aware she had it done. Was told it was not sciatica thought it was inside joint. Patient does not want to do ablations or steroid shots which was suggested, but wanted to start the process for the gastric sleeve. Does she need to get an appointment first? Please advise

## 2018-03-04 NOTE — Telephone Encounter (Signed)
She can go to bariatric seminar (go to [employer] cone website for bariatric services). This is a free informative session that talks through options and once she goes to that she can get into program to get bariatric procedure.

## 2018-03-04 NOTE — Telephone Encounter (Signed)
LVM informing patient of md response sending to [facility] if she was confused on what I was saying if patient calls back during working hours please send through to me. Thanks

## 2018-03-05 ENCOUNTER — Encounter: Payer: Managed Care, Other (non HMO) | Admitting: Gastroenterology

## 2018-03-05 NOTE — Telephone Encounter (Signed)
Was not able to call patient back before her free time was up if she calls back during working hours please transfer the call to our office.

## 2018-03-05 NOTE — Telephone Encounter (Signed)
Pt calling to speak with Briana. She is wanting to ask about the test results from her nerve conduction study. Also she will look in to the bariatric seminar. She states she is only at lunch for another 40 mins. If after that time frame she will not be able to answer. May leave detailed message.

## 2018-03-08 ENCOUNTER — Encounter: Payer: Self-pay | Admitting: Internal Medicine

## 2018-03-08 DIAGNOSIS — M5441 Lumbago with sciatica, right side: Secondary | ICD-10-CM

## 2018-03-08 NOTE — Telephone Encounter (Signed)
LVM informing  of MD response  

## 2018-03-08 NOTE — Telephone Encounter (Signed)
They did not see any nerve problems coming from the low back on the nerve conduction test.

## 2018-03-08 NOTE — Telephone Encounter (Signed)
Patient wanted you to look over her nerve conduction test, states she does not understand what she is seeing on mychart. Also states that she is going to go ahead and do the bariatric seminar

## 2018-03-10 NOTE — Telephone Encounter (Signed)
Does patient need another referral to get the injection, and do you want patient to make an appointment to discuss the weight loss?

## 2018-03-10 NOTE — Telephone Encounter (Signed)
Sent message to patient through my chart

## 2018-03-10 NOTE — Telephone Encounter (Signed)
Pt is calling to advise she will need another referral for a Injection. She is stating she is off work today and can be reached anytime? She is also wanting to know more information on her weight loss management. Please advise

## 2018-03-10 NOTE — Telephone Encounter (Signed)
I'm not sure who she saw that said they would give her an injection? Where is she wanting a referral to for injection. Weight loss as prior mychart message if she is wanting weight loss surgery should go to [place] website bariatrics and sign up for either physical seminar or webinar to start process for weight loss procedure.

## 2018-03-15 ENCOUNTER — Encounter: Payer: Self-pay | Admitting: Internal Medicine

## 2018-04-01 ENCOUNTER — Ambulatory Visit: Payer: Self-pay | Admitting: Neurology

## 2018-04-02 ENCOUNTER — Encounter: Payer: Self-pay | Admitting: Internal Medicine

## 2018-04-02 ENCOUNTER — Other Ambulatory Visit: Payer: Self-pay

## 2018-04-02 MED ORDER — CYANOCOBALAMIN 1000 MCG/ML IJ SOLN: 1000.0000 ug | INTRAMUSCULAR | 1 refills | Status: AC

## 2018-04-02 MED ORDER — "SYRINGE 20G X 1"" 3 ML MISC": 0 refills | Status: AC

## 2018-04-05 ENCOUNTER — Encounter: Payer: Self-pay | Admitting: Internal Medicine

## 2018-04-05 ENCOUNTER — Ambulatory Visit (INDEPENDENT_AMBULATORY_CARE_PROVIDER_SITE_OTHER): Payer: Managed Care, Other (non HMO) | Admitting: Internal Medicine

## 2018-04-05 ENCOUNTER — Ambulatory Visit (INDEPENDENT_AMBULATORY_CARE_PROVIDER_SITE_OTHER)
Admission: RE | Admit: 2018-04-05 | Discharge: 2018-04-05 | Disposition: A | Discharge status: Home / Self Care | Payer: Managed Care, Other (non HMO) | Source: Ambulatory Visit | Attending: Internal Medicine | Admitting: Internal Medicine

## 2018-04-05 ENCOUNTER — Telehealth: Payer: Self-pay | Admitting: Internal Medicine

## 2018-04-05 VITALS — BP 122/70 | HR 88 | Temp 98.7°F | Ht 64.0 in | Wt 267.0 lb

## 2018-04-05 DIAGNOSIS — M25562 Pain in left knee: Secondary | ICD-10-CM | POA: Diagnosis not present

## 2018-04-05 DIAGNOSIS — G8929 Other chronic pain: Secondary | ICD-10-CM

## 2018-04-05 DIAGNOSIS — M25561 Pain in right knee: Secondary | ICD-10-CM

## 2018-04-05 DIAGNOSIS — M5441 Lumbago with sciatica, right side: Secondary | ICD-10-CM

## 2018-04-05 MED ORDER — HYDROCODONE-ACETAMINOPHEN 5-325 MG PO TABS: 1.0000 | ORAL_TABLET | ORAL | 0 refills | Status: AC | PRN

## 2018-04-05 NOTE — Telephone Encounter (Signed)
Pt called upset because [facility] and the doctor she was seeing quit, she had an appt today for an injection but they canceled her appt. I told her to call [facility] to see if they will see her since she has a referral for them and if not to call back and we will refer her somewhere else.

## 2018-04-05 NOTE — Progress Notes (Signed)
   Subjective:    Patient ID: Bridget Fletcher, female    DOB: 05/19/1995, 22 y.o.   MRN: [id number]  HPI The patient is a 22 YO female coming in for ongoing back pain and sciatica. Denies new numbness or weakness. No new change in bowel or bladder control. She has seen back specialist and was getting an injection today but the doctor left abruptly. She is going to try to reschedule with that office. She is also trying to get in at another office but it could be some time. She had gotten some hydrocodone for pain until the injection could be done. She is mostly using this at night time. Sometimes during the day if severe and she is home. She denies injury or overuse. She denies using medication more than prescribed. She is having a hard time functioning with all these problems. She is also pursuing weight loss procedure to help her health overall. She has tried many diets and options and exercise over the years without success. In fact she has overall gained weight some over the years.   Review of Systems  Constitutional: Positive for activity change and unexpected weight change. Negative for appetite change, chills, fatigue and fever.  Respiratory: Negative.   Cardiovascular: Negative.   Gastrointestinal: Negative.   Musculoskeletal: Positive for arthralgias, back pain and myalgias. Negative for gait problem and joint swelling.  Skin: Negative.   Neurological: Negative.       Objective:   Physical Exam  Constitutional: She is oriented to person, place, and time. She appears well-developed and well-nourished.  HENT:  Head: Normocephalic and atraumatic.  Eyes: EOM are normal.  Neck: Normal range of motion.  Cardiovascular: Normal rate and regular rhythm.  Pulmonary/Chest: Effort normal and breath sounds normal. No respiratory distress. She has no wheezes. She has no rales.  Abdominal: Soft. Bowel sounds are normal. She exhibits no distension. There is no tenderness. There is no rebound.    Musculoskeletal: She exhibits tenderness. She exhibits no edema.  Pain low back  Neurological: She is alert and oriented to person, place, and time. A cranial nerve deficit is present. Coordination normal.  Stable numbness right thigh  Skin: Skin is warm and dry.  Psychiatric: She has a normal mood and affect.   Vitals:   04/05/18 1500  BP: 122/70  Pulse: 88  Temp: 98.7 F (37.1 C)  TempSrc: Oral  Weight: 267 lb (121.1 kg)  Height: 5\' 4"  (1.626 m)      Assessment & Plan:

## 2018-04-05 NOTE — Telephone Encounter (Signed)
Due to [place] in [place] controlled substances (hydrodocone) we would have to see her for the hydrocodone and could then prescribe a 5 day supply of medication if appropriate.

## 2018-04-05 NOTE — Patient Instructions (Signed)
We have sent in the hydrocodone to use for night time. I have written the prescription for 1 every 4 hours as needed so you could fill the medicine.

## 2018-04-05 NOTE — Telephone Encounter (Signed)
appt scheduled for today since patient was off work

## 2018-04-05 NOTE — Telephone Encounter (Signed)
Noted  

## 2018-04-05 NOTE — Telephone Encounter (Signed)
Patient wanted to know if Dr Okey Duprerawford could prescribe her hydrocodone for the pain. She said that Dr Dell PontoHwang was the original prescriber for this. She would like the nurse to call her back. [phone]

## 2018-04-05 NOTE — Telephone Encounter (Signed)
Pt called back and cannot get in with [facility] until January , please advise on new referral

## 2018-04-06 NOTE — Assessment & Plan Note (Signed)
Checked [place] narcotic database and rx for 5 day supply hydrocodone to see if this can help with her pain. We will work on getting her in for injections to help with her pain to resolve this. She is also working on weight which might help with her pain.

## 2018-04-20 ENCOUNTER — Institutional Professional Consult (permissible substitution): Payer: Managed Care, Other (non HMO) | Admitting: Neurology

## 2018-04-26 ENCOUNTER — Encounter: Payer: Self-pay | Admitting: Internal Medicine

## 2018-04-26 DIAGNOSIS — R0683 Snoring: Secondary | ICD-10-CM

## 2018-04-27 NOTE — Addendum Note (Signed)
Addended by: Hillard DankerRAWFORD, ELIZABETH A on: 04/27/2018 08:19 AM   Modules accepted: Orders

## 2018-05-11 ENCOUNTER — Encounter: Payer: Self-pay | Admitting: Internal Medicine

## 2018-05-11 ENCOUNTER — Ambulatory Visit (INDEPENDENT_AMBULATORY_CARE_PROVIDER_SITE_OTHER): Payer: Managed Care, Other (non HMO) | Admitting: Internal Medicine

## 2018-05-11 DIAGNOSIS — R3915 Urgency of urination: Secondary | ICD-10-CM

## 2018-05-11 LAB — POCT URINALYSIS DIPSTICK
Bilirubin, UA: NEGATIVE
Blood, UA: POSITIVE
GLUCOSE UA: NEGATIVE
Ketones, UA: NEGATIVE
LEUKOCYTES UA: NEGATIVE
Nitrite, UA: NEGATIVE
PROTEIN UA: NEGATIVE
Spec Grav, UA: 1.02 (ref 1.010–1.025)
Urobilinogen, UA: 0.2 E.U./dL
pH, UA: 6 (ref 5.0–8.0)

## 2018-05-11 NOTE — Assessment & Plan Note (Signed)
POC U/A done in the office without signs of infection. She is encouraged to stop all sodas and do kegels. Instruction given on kegels.

## 2018-05-11 NOTE — Progress Notes (Signed)
   Subjective:   Patient ID: Bridget Fletcher, female    DOB: 03/23/1996, 22 y.o.   MRN: [id number]  HPI The patient is a 22 YO female coming in for follow up of her morbid obesity. She is still working on weight loss in anticipation of her weight loss procedure. She is needing an EKG today for that reason to prep for procedure. She is down about 25 pounds since last year this time. She also having some problems with uti (had one last month, now feels like she cannot empty bladder fully, is needing to wear pad now, denies pain with urination, denies burning, denies fevers or chills, she is drinking slightly more caffeine in the form of sodas recently, she was treated for UTI a month ago and did not have any improvement in symptoms).   Review of Systems  Constitutional: Negative.   HENT: Negative.   Eyes: Negative.   Respiratory: Negative for cough, chest tightness and shortness of breath.   Cardiovascular: Negative for chest pain, palpitations and leg swelling.  Gastrointestinal: Negative for abdominal distention, abdominal pain, constipation, diarrhea, nausea and vomiting.  Genitourinary: Positive for urgency.       Leaking  Musculoskeletal: Negative.   Skin: Negative.   Neurological: Negative.   Psychiatric/Behavioral: Negative.     Objective:  Physical Exam Constitutional:      Appearance: She is well-developed.  HENT:     Head: Normocephalic and atraumatic.  Neck:     Musculoskeletal: Normal range of motion.  Cardiovascular:     Rate and Rhythm: Normal rate and regular rhythm.  Pulmonary:     Effort: Pulmonary effort is normal. No respiratory distress.     Breath sounds: Normal breath sounds. No wheezing or rales.  Abdominal:     General: Bowel sounds are normal. There is no distension.     Palpations: Abdomen is soft.     Tenderness: There is no abdominal tenderness. There is no rebound.  Skin:    General: Skin is warm and dry.  Neurological:     Mental Status: She is alert  and oriented to person, place, and time.     Coordination: Coordination normal.     Vitals:   05/11/18 1014  BP: 110/70  Pulse: 80  Temp: 99.1 F (37.3 C)  TempSrc: Oral  Weight: 269 lb (122 kg)  Height: 5\' 4"  (1.626 m)   EKG: Rate 76, axis normal, interval normal, no st or t wave changes, sinus, no change from prior  Assessment & Plan:

## 2018-05-11 NOTE — Assessment & Plan Note (Signed)
EKG done and normal. She is continuing to work on weight loss and for her health she is pursuing weight loss procedure. She is encouraged to stop sodas and drink only water to help encourage weight loss.

## 2018-05-11 NOTE — Patient Instructions (Signed)
The EKG is normal and the urine looks normal.    Kegel Exercises Kegel exercises help strengthen the muscles that support the rectum, vagina, small intestine, bladder, and uterus. Doing Kegel exercises can help:  Improve bladder and bowel control.  Improve sexual response.  Reduce problems and discomfort during pregnancy. Kegel exercises involve squeezing your pelvic floor muscles, which are the same muscles you squeeze when you try to stop the flow of urine. The exercises can be done while sitting, standing, or lying down, but it is best to vary your position. Exercises 1. Squeeze your pelvic floor muscles tight. You should feel a tight lift in your rectal area. If you are a female, you should also feel a tightness in your vaginal area. Keep your stomach, buttocks, and legs relaxed. 2. Hold the muscles tight for up to 10 seconds. 3. Relax your muscles. Repeat this exercise 50 times a day or as many times as told by your health care provider. Continue to do this exercise for at least 4-6 weeks or for as long as told by your health care provider. This information is not intended to replace advice given to you by your health care provider. Make sure you discuss any questions you have with your health care provider. Document Released: 04/21/2012 Document Revised: 09/15/2016 Document Reviewed: 03/25/2015 Elsevier Interactive Patient Education  2019 [employer]

## 2018-06-02 ENCOUNTER — Encounter: Payer: Self-pay | Admitting: Internal Medicine

## 2018-06-08 ENCOUNTER — Telehealth: Payer: Self-pay | Admitting: Internal Medicine

## 2018-06-08 NOTE — Telephone Encounter (Signed)
Pt is seeing a Doctor with [facility] for her weight and is handling her weight loss surgery and her new  Insurance is requiring notes about her diet and nutrition for a year. She does not want to start over but will if she has to. I gave her the medical records phone number so she can get her office visit notes.

## 2018-06-08 NOTE — Telephone Encounter (Signed)
Noted  

## 2018-07-01 ENCOUNTER — Telehealth: Payer: Self-pay

## 2018-07-01 NOTE — Telephone Encounter (Signed)
Copied from CRM [id number]. Topic: General - Other >> Jun 30, 2018  4:44 PM Arlyss Gandy, NT wrote: Reason for CRM: Pt requesting a note from Dr. Okey Dupre that states she had a physical on 02/18/2018 and that everything was well for her job. Please advise.

## 2018-07-01 NOTE — Telephone Encounter (Signed)
Is it okay to write note for patient

## 2018-07-01 NOTE — Telephone Encounter (Signed)
Okay to write

## 2019-04-25 ENCOUNTER — Other Ambulatory Visit: Payer: Self-pay

## 2019-04-25 DIAGNOSIS — Z20822 Contact with and (suspected) exposure to covid-19: Secondary | ICD-10-CM

## 2019-04-26 LAB — NOVEL CORONAVIRUS, NAA: SARS-CoV-2, NAA: NOT DETECTED

## 2019-05-24 ENCOUNTER — Ambulatory Visit: Payer: No Typology Code available for payment source | Attending: Internal Medicine

## 2019-05-24 DIAGNOSIS — Z20822 Contact with and (suspected) exposure to covid-19: Secondary | ICD-10-CM | POA: Insufficient documentation

## 2019-05-26 LAB — NOVEL CORONAVIRUS, NAA: SARS-CoV-2, NAA: NOT DETECTED

## 2019-05-28 ENCOUNTER — Other Ambulatory Visit: Payer: Self-pay

## 2019-05-28 ENCOUNTER — Emergency Department (HOSPITAL_BASED_OUTPATIENT_CLINIC_OR_DEPARTMENT_OTHER)
Admission: EM | Admit: 2019-05-28 | Discharge: 2019-05-28 | Disposition: A | Discharge status: Home / Self Care | Payer: Self-pay | Attending: Emergency Medicine | Admitting: Emergency Medicine

## 2019-05-28 ENCOUNTER — Encounter (HOSPITAL_BASED_OUTPATIENT_CLINIC_OR_DEPARTMENT_OTHER): Payer: Self-pay | Admitting: *Deleted

## 2019-05-28 DIAGNOSIS — Z793 Long term (current) use of hormonal contraceptives: Secondary | ICD-10-CM | POA: Insufficient documentation

## 2019-05-28 DIAGNOSIS — Z79899 Other long term (current) drug therapy: Secondary | ICD-10-CM | POA: Insufficient documentation

## 2019-05-28 DIAGNOSIS — Y9389 Activity, other specified: Secondary | ICD-10-CM | POA: Insufficient documentation

## 2019-05-28 DIAGNOSIS — G44319 Acute post-traumatic headache, not intractable: Secondary | ICD-10-CM

## 2019-05-28 DIAGNOSIS — E119 Type 2 diabetes mellitus without complications: Secondary | ICD-10-CM | POA: Insufficient documentation

## 2019-05-28 DIAGNOSIS — Y999 Unspecified external cause status: Secondary | ICD-10-CM | POA: Insufficient documentation

## 2019-05-28 DIAGNOSIS — S29012A Strain of muscle and tendon of back wall of thorax, initial encounter: Secondary | ICD-10-CM | POA: Insufficient documentation

## 2019-05-28 DIAGNOSIS — Y92410 Unspecified street and highway as the place of occurrence of the external cause: Secondary | ICD-10-CM | POA: Insufficient documentation

## 2019-05-28 DIAGNOSIS — Z794 Long term (current) use of insulin: Secondary | ICD-10-CM | POA: Insufficient documentation

## 2019-05-28 DIAGNOSIS — T148XXA Other injury of unspecified body region, initial encounter: Secondary | ICD-10-CM

## 2019-05-28 MED ORDER — METHOCARBAMOL 500 MG PO TABS: 500.0000 mg | ORAL_TABLET | Freq: Every evening | ORAL | 0 refills | Status: AC | PRN

## 2019-05-28 NOTE — ED Notes (Signed)
Pt was called twice and was not present. She was present the third time, but her FT room was given to another patient at that point. Pt informed that she would need to wait for the next available room.
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
## 2019-05-28 NOTE — Discharge Instructions (Signed)
Take Tylenol 3 times a day for pain  Use robaxin as needed for muscle stiffness or soreness.  Have caution, this may make you tired or groggy.  Do not drive or operate heavy machinery while taking this medicine. Use ice packs or heating pads if this helps control your pain. You will likely have continued muscle stiffness and soreness over the next couple days.  Follow-up with primary care in 1 week if your symptoms are not improving. Return to the emergency room if you develop vision changes, vomiting, slurred speech, numbness, loss of bowel or bladder control, or any new or worsening symptoms.
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
## 2019-07-18 ENCOUNTER — Telehealth: Payer: Self-pay | Admitting: Neurology

## 2019-07-18 NOTE — Telephone Encounter (Signed)
Pt called stating that she is needing medical records to call her back. Please advise.

## 2019-07-18 NOTE — Telephone Encounter (Signed)
Done

## 2019-07-19 ENCOUNTER — Encounter: Payer: Self-pay | Admitting: Neurology

## 2019-07-26 ENCOUNTER — Other Ambulatory Visit: Payer: Self-pay | Admitting: Physical Medicine and Rehabilitation

## 2019-07-28 ENCOUNTER — Other Ambulatory Visit: Payer: Self-pay | Admitting: Physical Medicine and Rehabilitation

## 2019-08-20 ENCOUNTER — Ambulatory Visit: Payer: Self-pay | Attending: Internal Medicine

## 2019-08-20 DIAGNOSIS — Z23 Encounter for immunization: Secondary | ICD-10-CM

## 2019-08-20 NOTE — Progress Notes (Signed)
   Covid-19 Vaccination Clinic  Name:  Bridget Fletcher    MRN: [id number] DOB: 1995-08-09  08/20/2019  [name] was observed post Covid-19 immunization for 15 minutes without incident. She was provided with Vaccine Information Sheet and instruction to access the V-Safe system.   [name] was instructed to call 911 with any severe reactions post vaccine: Marland Kitchen Difficulty breathing  . Swelling of face and throat  . A fast heartbeat  . A bad rash all over body  . Dizziness and weakness   Immunizations Administered    Name Date Dose VIS Date Route   Pfizer COVID-19 Vaccine 08/20/2019  8:10 AM 0.3 mL 04/29/2019 Intramuscular   Manufacturer: [employer], [employer]   Lot: [id number]   NDC: [address]-9

## 2019-08-29 ENCOUNTER — Ambulatory Visit: Payer: Self-pay | Admitting: Neurology

## 2019-09-14 ENCOUNTER — Ambulatory Visit: Payer: Self-pay | Attending: Internal Medicine

## 2019-09-14 DIAGNOSIS — Z23 Encounter for immunization: Secondary | ICD-10-CM

## 2019-09-14 NOTE — Progress Notes (Signed)
   Covid-19 Vaccination Clinic  Name:  Bari Leib    MRN: [id number] DOB: Jul 30, 1995  09/14/2019  Ms. Vint was observed post Covid-19 immunization for 15 minutes without incident. She was provided with Vaccine Information Sheet and instruction to access the V-Safe system.   Ms. Castellanos was instructed to call 911 with any severe reactions post vaccine: Marland Kitchen Difficulty breathing  . Swelling of face and throat  . A fast heartbeat  . A bad rash all over body  . Dizziness and weakness   Immunizations Administered    Name Date Dose VIS Date Route   Pfizer COVID-19 Vaccine 09/14/2019  8:16 AM 0.3 mL 07/13/2018 Intramuscular   Manufacturer: [employer], [employer]   Lot: [id number]   NDC: [address]-3

## 2020-10-21 ENCOUNTER — Emergency Department (HOSPITAL_BASED_OUTPATIENT_CLINIC_OR_DEPARTMENT_OTHER)
Admission: EM | Admit: 2020-10-21 | Discharge: 2020-10-21 | Disposition: A | Discharge status: Home / Self Care | Payer: Self-pay | Attending: Emergency Medicine | Admitting: Emergency Medicine

## 2020-10-21 ENCOUNTER — Emergency Department (HOSPITAL_BASED_OUTPATIENT_CLINIC_OR_DEPARTMENT_OTHER): Payer: Self-pay

## 2020-10-21 ENCOUNTER — Encounter (HOSPITAL_BASED_OUTPATIENT_CLINIC_OR_DEPARTMENT_OTHER): Payer: Self-pay

## 2020-10-21 ENCOUNTER — Other Ambulatory Visit: Payer: Self-pay

## 2020-10-21 DIAGNOSIS — E119 Type 2 diabetes mellitus without complications: Secondary | ICD-10-CM | POA: Insufficient documentation

## 2020-10-21 DIAGNOSIS — Z794 Long term (current) use of insulin: Secondary | ICD-10-CM | POA: Insufficient documentation

## 2020-10-21 DIAGNOSIS — U071 COVID-19: Secondary | ICD-10-CM | POA: Insufficient documentation

## 2020-10-21 DIAGNOSIS — R509 Fever, unspecified: Secondary | ICD-10-CM

## 2020-10-21 DIAGNOSIS — R000 Tachycardia, unspecified: Secondary | ICD-10-CM | POA: Insufficient documentation

## 2020-10-21 LAB — COMPREHENSIVE METABOLIC PANEL
ALT: 14 U/L (ref 0–44)
AST: 17 U/L (ref 15–41)
Albumin: 3.1 g/dL — ABNORMAL LOW (ref 3.5–5.0)
Alkaline Phosphatase: 81 U/L (ref 38–126)
Anion gap: 8 (ref 5–15)
BUN: 6 mg/dL (ref 6–20)
CO2: 23 mmol/L (ref 22–32)
Calcium: 8.3 mg/dL — ABNORMAL LOW (ref 8.9–10.3)
Chloride: 105 mmol/L (ref 98–111)
Creatinine, Ser: 0.71 mg/dL (ref 0.44–1.00)
GFR, Estimated: >60 mL/min (ref >60)
Glucose, Bld: 101 mg/dL — ABNORMAL HIGH (ref 70–99)
Potassium: 3.2 mmol/L — ABNORMAL LOW (ref 3.5–5.1)
Sodium: 136 mmol/L (ref 135–145)
Total Bilirubin: 0.5 mg/dL (ref 0.3–1.2)
Total Protein: 6.9 g/dL (ref 6.5–8.1)

## 2020-10-21 LAB — CBC WITH DIFFERENTIAL/PLATELET
Abs Immature Granulocytes: 0.01 10*3/uL (ref 0.00–0.07)
Basophils Absolute: 0 10*3/uL (ref 0.0–0.1)
Basophils Relative: 0 %
Eosinophils Absolute: 0 10*3/uL (ref 0.0–0.5)
Eosinophils Relative: 0 %
HCT: 34.1 % — ABNORMAL LOW (ref 36.0–46.0)
Hemoglobin: 11.7 g/dL — ABNORMAL LOW (ref 12.0–15.0)
Immature Granulocytes: 0 %
Lymphocytes Relative: 16 %
Lymphs Abs: 0.8 10*3/uL (ref 0.7–4.0)
MCH: 26.3 pg (ref 26.0–34.0)
MCHC: 34.3 g/dL (ref 30.0–36.0)
MCV: 76.6 fL — ABNORMAL LOW (ref 80.0–100.0)
Monocytes Absolute: 0.4 10*3/uL (ref 0.1–1.0)
Monocytes Relative: 7 %
Neutro Abs: 4 10*3/uL (ref 1.7–7.7)
Neutrophils Relative %: 77 %
Platelets: 328 10*3/uL (ref 150–400)
RBC: 4.45 MIL/uL (ref 3.87–5.11)
RDW: 13.8 % (ref 11.5–15.5)
WBC: 5.3 10*3/uL (ref 4.0–10.5)
nRBC: 0 % (ref 0.0–0.2)

## 2020-10-21 LAB — PREGNANCY, URINE: Preg Test, Ur: NEGATIVE

## 2020-10-21 LAB — URINALYSIS, ROUTINE W REFLEX MICROSCOPIC
Bilirubin Urine: NEGATIVE
Glucose, UA: NEGATIVE mg/dL
Ketones, ur: 15 mg/dL — AB
Leukocytes,Ua: NEGATIVE
Nitrite: NEGATIVE
Protein, ur: NEGATIVE mg/dL
Specific Gravity, Urine: 1.02 (ref 1.005–1.030)
pH: 5.5 (ref 5.0–8.0)

## 2020-10-21 LAB — URINALYSIS, MICROSCOPIC (REFLEX)

## 2020-10-21 LAB — RESP PANEL BY RT-PCR (FLU A&B, COVID) ARPGX2
Influenza A by PCR: NEGATIVE
Influenza B by PCR: NEGATIVE
SARS Coronavirus 2 by RT PCR: POSITIVE — AB

## 2020-10-21 LAB — LIPASE, BLOOD: Lipase: 25 U/L (ref 11–51)

## 2020-10-21 MED ORDER — SODIUM CHLORIDE 0.9 % IV BOLUS
1000.0000 mL | Freq: Once | INTRAVENOUS | Status: AC
  Administered 2020-10-21: 1000 mL via INTRAVENOUS

## 2020-10-21 MED ORDER — ACETAMINOPHEN 325 MG PO TABS
650.0000 mg | ORAL_TABLET | Freq: Once | ORAL | Status: AC | PRN
  Administered 2020-10-21: 650 mg via ORAL
  Filled 2020-10-21: qty 2

## 2020-10-21 MED ORDER — ONDANSETRON HCL 4 MG/2ML IJ SOLN
4.0000 mg | Freq: Once | INTRAMUSCULAR | Status: AC
  Administered 2020-10-21: 4 mg via INTRAVENOUS
  Filled 2020-10-21: qty 2

## 2020-10-21 NOTE — Discharge Instructions (Signed)
Your COVID test was positive.  Recommend continued use of Tylenol at home for fever.  Take 1000 mg of Tylenol every 6 hours as needed for pain.  Please return if you develop any worsening symptoms specifically worsening respiratory symptoms.  Your chest x-ray today was normal.  Follow CDC guidelines for quarantine.

## 2020-10-21 NOTE — ED Triage Notes (Signed)
Pt reports body aches, cough, nasal drainage, nausea for the past couple days. No medications today.

## 2020-10-21 NOTE — ED Provider Notes (Signed)
[facility] EMERGENCY DEPARTMENT Provider Note   CSN: [id number] Arrival date & time: 10/21/20  1916     History Chief Complaint  Patient presents with  . URI    Bridget Fletcher is a 25 y.o. female.   URI Presenting symptoms: congestion, cough, fatigue and fever   Presenting symptoms: no ear pain and no sore throat   Severity:  Mild Onset quality:  Gradual Timing:  Intermittent Progression:  Waxing and waning Chronicity:  New Relieved by:  Nothing Worsened by:  Nothing Associated symptoms: myalgias   Associated symptoms: no arthralgias and no headaches   Risk factors: no sick contacts        Past Medical History:  Diagnosis Date  . Diabetes mellitus without complication (HCC)   . E. coli UTI 07/29/15   [facility] ; sensitive to Cipro  . Gall stones   . GERD (gastroesophageal reflux disease)     Patient Active Problem List   Diagnosis Date Noted  . Urinary urgency 05/11/2018  . Routine general medical examination at a health care facility 02/18/2018  . Acute bilateral low back pain with right-sided sciatica 11/17/2017  . B12 deficiency 08/11/2016  . Vitamin D deficiency 08/11/2016  . Numbness and tingling of right leg 05/06/2016  . Cholelithiasis 05/23/2015  . Morbid obesity (HCC) 05/23/2015  . Fatty liver 05/23/2015  . Diabetes mellitus type 2 in obese (HCC) 05/23/2015    Past Surgical History:  Procedure Laterality Date  . CHOLECYSTECTOMY N/A 07/06/2015   Procedure: LAPAROSCOPIC CHOLECYSTECTOMY WITH INTRAOPERATIVE CHOLANGIOGRAM;  Surgeon: Avel Peace, MD;  Location: [facility] ORS;  Service: General;  Laterality: N/A;  . TONSILLECTOMY AND ADENOIDECTOMY       OB History   No obstetric history on file.     Family History  Problem Relation Age of Onset  . Hypertension Mother   . Diabetes Mother   . Hypertension Maternal Grandmother   . Liver cancer Other     Social History   Tobacco Use  . Smoking status: Never Smoker  .  Smokeless tobacco: Never Used  Vaping Use  . Vaping Use: Never used  Substance Use Topics  . Alcohol use: Yes    Alcohol/week: 0.0 standard drinks    Comment: rare  . Drug use: No    Home Medications Prior to Admission medications   Medication Sig Start Date End Date Taking? Authorizing Provider  cyanocobalamin (,VITAMIN B-12,) 1000 MCG/ML injection Inject 1 mL (1,000 mcg total) into the muscle every 30 (thirty) days. 04/02/18   Myrlene Broker, MD  etonogestrel-ethinyl estradiol (NUVARING) 0.12-0.015 MG/24HR vaginal ring Place 1 each vaginally every 28 (twenty-eight) days. Insert vaginally and leave in place for 3 consecutive weeks, then remove for 1 week.    [provider]  HYDROcodone-acetaminophen (NORCO/VICODIN) 5-325 MG tablet Take 1 tablet by mouth every 4 (four) hours as needed for moderate pain. 04/05/18   Myrlene Broker, MD  Insulin Pen Needle (NOVOFINE) 32G X 6 MM MISC Use with Saxenda daily 09/16/17   Myrlene Broker, MD  Liraglutide -Weight Management (SAXENDA) 18 MG/3ML SOPN Inject 3 mg into the skin daily. 03/02/18   Myrlene Broker, MD  methocarbamol (ROBAXIN) 500 MG tablet Take 1 tablet (500 mg total) by mouth at bedtime as needed. 05/28/19   Caccavale, Sophia, PA-C  ondansetron (ZOFRAN) 4 MG tablet TAKE 1 TABLET BY MOUTH EVERY 8 HOURS AS NEEDED FOR NAUSEA OR VOMITING 01/27/18   [provider]  pantoprazole (PROTONIX) 40  MG tablet Take 1 tablet (40 mg total) by mouth daily. 02/15/18   Olive Bass, FNP  Syringe/Needle, Disp, (SYRINGE 3CC/20GX1") 20G X 1" 3 ML MISC Use monthly for B12 shots 04/02/18   Myrlene Broker, MD    Allergies    Patient has no known allergies.  Review of Systems   Review of Systems  Constitutional: Positive for fatigue and fever. Negative for chills.  HENT: Positive for congestion. Negative for ear pain and sore throat.   Eyes: Negative for pain and visual disturbance.  Respiratory:  Positive for cough. Negative for shortness of breath.   Cardiovascular: Negative for chest pain and palpitations.  Gastrointestinal: Negative for abdominal pain and vomiting.  Genitourinary: Negative for dysuria and hematuria.  Musculoskeletal: Positive for myalgias. Negative for arthralgias and back pain.  Skin: Negative for color change and rash.  Neurological: Negative for seizures, syncope and headaches.  All other systems reviewed and are negative.   Physical Exam Updated Vital Signs BP 111/61   Pulse 85   Temp (!) 103.2 F (39.6 C) (Oral)   Resp 20   Ht 5\' 4"  (1.626 m)   Wt 68 kg   LMP 10/19/2020   SpO2 95%   BMI 25.75 kg/m   Physical Exam Vitals and nursing note reviewed.  Constitutional:      General: She is not in acute distress.    Appearance: She is well-developed. She is not ill-appearing.  HENT:     Head: Normocephalic and atraumatic.     Nose: Congestion present.  Eyes:     Extraocular Movements: Extraocular movements intact.     Conjunctiva/sclera: Conjunctivae normal.     Pupils: Pupils are equal, round, and reactive to light.  Cardiovascular:     Rate and Rhythm: Regular rhythm. Tachycardia present.     Pulses: Normal pulses.     Heart sounds: Normal heart sounds. No murmur heard.   Pulmonary:     Effort: Pulmonary effort is normal. No respiratory distress.     Breath sounds: Normal breath sounds.  Abdominal:     Palpations: Abdomen is soft.     Tenderness: There is no abdominal tenderness.  Musculoskeletal:     Cervical back: Neck supple.  Skin:    General: Skin is warm and dry.  Neurological:     General: No focal deficit present.     Mental Status: She is alert.     ED Results / Procedures / Treatments   Labs (all labs ordered are listed, but only abnormal results are displayed) Labs Reviewed  RESP PANEL BY RT-PCR (FLU A&B, COVID) ARPGX2 - Abnormal; Notable for the following components:      Result Value   SARS Coronavirus 2 by RT  PCR POSITIVE (*)    All other components within normal limits  CBC WITH DIFFERENTIAL/PLATELET - Abnormal; Notable for the following components:   Hemoglobin 11.7 (*)    HCT 34.1 (*)    MCV 76.6 (*)    All other components within normal limits  COMPREHENSIVE METABOLIC PANEL - Abnormal; Notable for the following components:   Potassium 3.2 (*)    Glucose, Bld 101 (*)    Calcium 8.3 (*)    Albumin 3.1 (*)    All other components within normal limits  URINALYSIS, ROUTINE W REFLEX MICROSCOPIC - Abnormal; Notable for the following components:   Hgb urine dipstick SMALL (*)    Ketones, ur 15 (*)    All other components within normal limits  URINALYSIS, MICROSCOPIC (REFLEX) - Abnormal; Notable for the following components:   Bacteria, UA RARE (*)    All other components within normal limits  LIPASE, BLOOD  PREGNANCY, URINE    EKG None  Radiology DG Chest Port 1 View  Result Date: 10/21/2020 CLINICAL DATA:  Body aches, cough. EXAM: PORTABLE CHEST 1 VIEW COMPARISON:  Chest x-ray dated 09/13/2015. FINDINGS: Heart size and mediastinal contours are within normal limits. Lungs are clear. No pleural effusion or pneumothorax is seen. Osseous structures about the chest are unremarkable. IMPRESSION: No active disease. No evidence of pneumonia. Electronically Signed   By: Bary Richard M.D.   On: 10/21/2020 20:54    Procedures Procedures   Medications Ordered in ED Medications  acetaminophen (TYLENOL) tablet 650 mg (650 mg Oral Given 10/21/20 1947)  sodium chloride 0.9 % bolus 1,000 mL (0 mLs Intravenous Stopped 10/21/20 2145)  ondansetron (ZOFRAN) injection 4 mg (4 mg Intravenous Given 10/21/20 2033)    ED Course  I have reviewed the triage vital signs and the nursing notes.  Pertinent labs & imaging results that were available during my care of the patient were reviewed by me and considered in my medical decision making (see chart for details).    MDM Rules/Calculators/A&P                           Anah Billard is here with fever and body aches.  Febrile and tachycardic upon arrival.  Chest x-ray negative for infection.  Urinalysis negative for infection.  No significant anemia, electrolyte abnormality, kidney injury.  Felt better after Tylenol, fluids, Zofran.  COVID test was positive.  Overall she appears well.  No respiratory distress.  No hypoxia.  Understands return precautions and discharged from the ED in good condition.  This chart was dictated using voice recognition software.  Despite best efforts to proofread,  errors can occur which can change the documentation meaning.    Final Clinical Impression(s) / ED Diagnoses Final diagnoses:  COVID-19    Rx / DC Orders ED Discharge Orders    None       Virgina Norfolk, DO 10/21/20 2201

## 2021-12-07 IMAGING — DX DG CHEST 1V PORT
1 series · 1 of 1 positions shown · non-contrast
Comparison: Chest x-ray dated 09/13/2015.

CLINICAL DATA: Body aches, cough.

EXAM:
PORTABLE CHEST 1 VIEW

[chest ap]
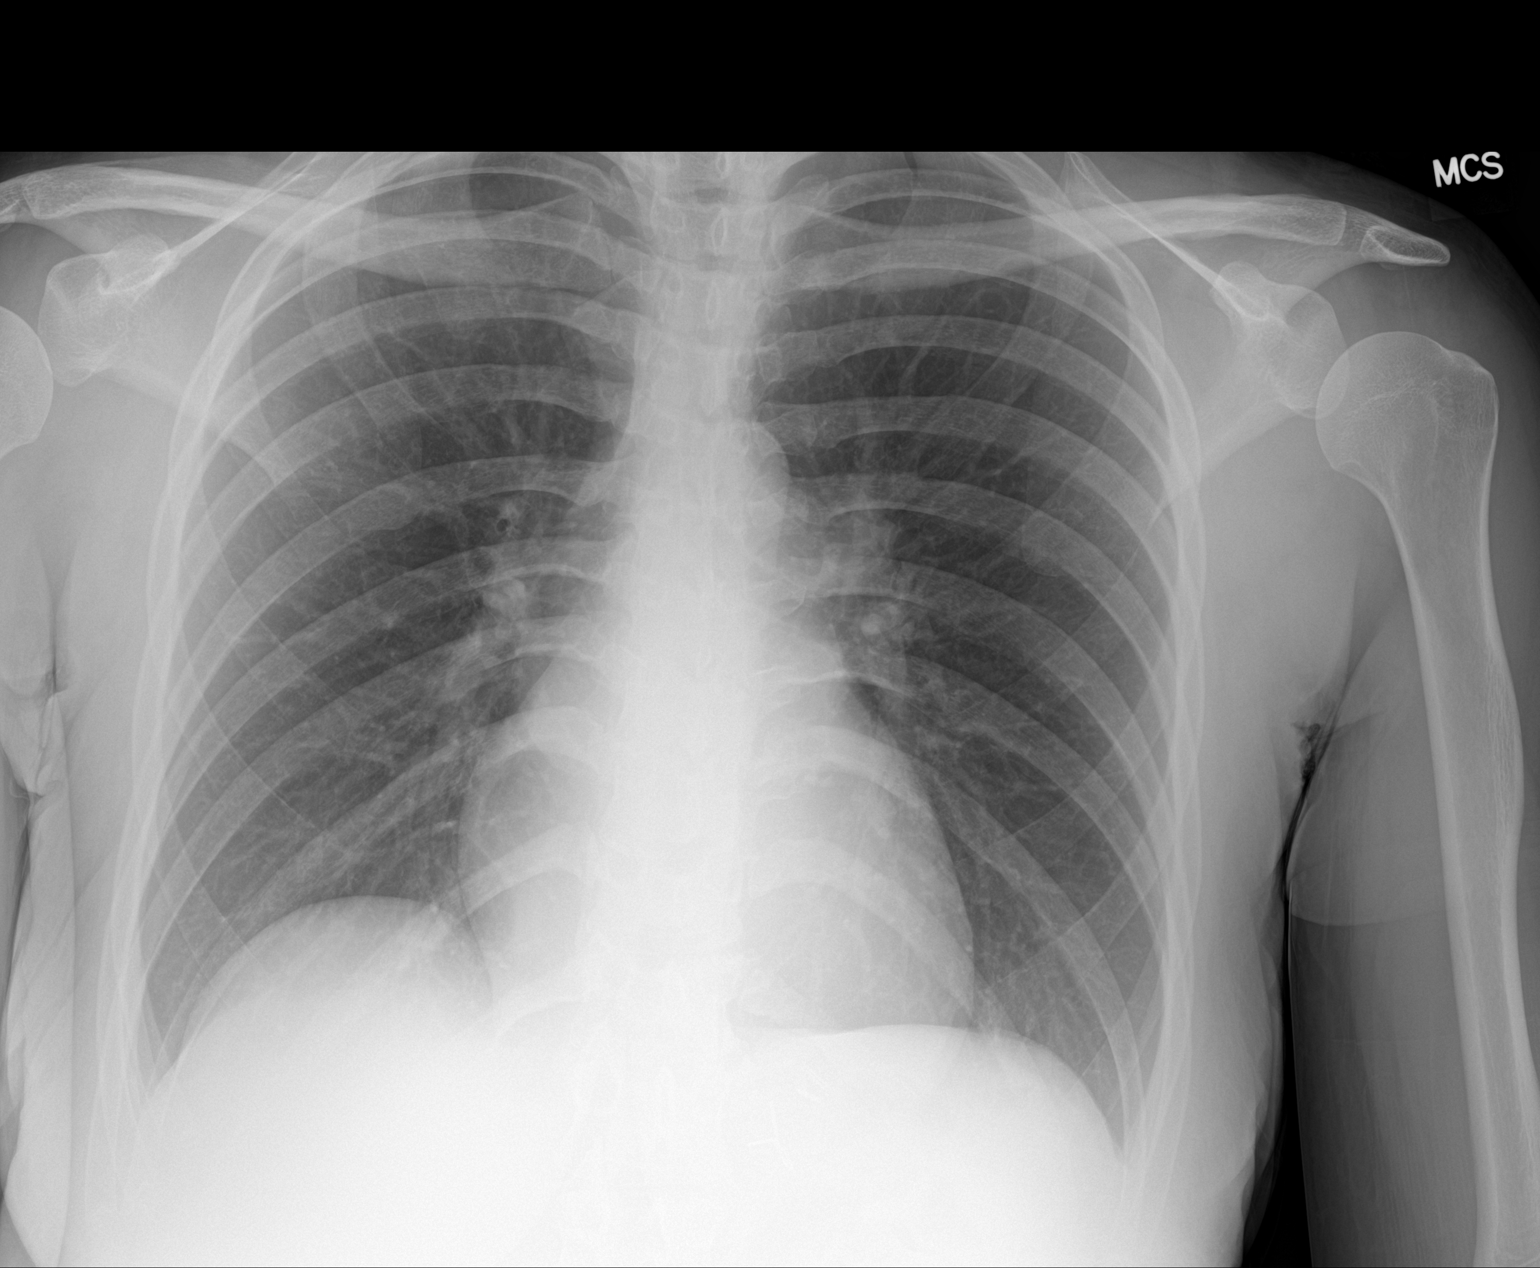

[1 of 1 positions shown; findings below may reference images not displayed]

FINDINGS: Heart size and mediastinal contours are within normal limits. Lungs
are clear. No pleural effusion or pneumothorax is seen. Osseous
structures about the chest are unremarkable.
IMPRESSION: No active disease. No evidence of pneumonia.
